# Patient Record
Sex: Female | Born: 1990 | Race: White | Hispanic: No | Marital: Single | Smoking: Never smoker
Health system: Southern US, Community
[De-identification: ages and names within clinical notes are randomized; demographics above are authoritative.]

## PROBLEM LIST (undated history)

## (undated) ENCOUNTER — Emergency Department: Discharge status: Absent without leave | Payer: Self-pay

## (undated) DIAGNOSIS — E282 Polycystic ovarian syndrome: Secondary | ICD-10-CM

## (undated) DIAGNOSIS — T7840XA Allergy, unspecified, initial encounter: Secondary | ICD-10-CM

## (undated) DIAGNOSIS — F329 Major depressive disorder, single episode, unspecified: Secondary | ICD-10-CM

## (undated) DIAGNOSIS — F419 Anxiety disorder, unspecified: Secondary | ICD-10-CM

## (undated) DIAGNOSIS — F32A Depression, unspecified: Secondary | ICD-10-CM

## (undated) HISTORY — DX: Major depressive disorder, single episode, unspecified: F32.9

## (undated) HISTORY — DX: Polycystic ovarian syndrome: E28.2

## (undated) HISTORY — DX: Depression, unspecified: F32.A

## (undated) HISTORY — DX: Allergy, unspecified, initial encounter: T78.40XA

## (undated) HISTORY — PX: EYE SURGERY: SHX253

## (undated) HISTORY — DX: Anxiety disorder, unspecified: F41.9

---
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
## 2011-01-25 ENCOUNTER — Ambulatory Visit (INDEPENDENT_AMBULATORY_CARE_PROVIDER_SITE_OTHER): Payer: BC Managed Care – PPO

## 2011-01-25 DIAGNOSIS — J069 Acute upper respiratory infection, unspecified: Secondary | ICD-10-CM

## 2011-01-25 DIAGNOSIS — R05 Cough: Secondary | ICD-10-CM

## 2011-01-25 DIAGNOSIS — J159 Unspecified bacterial pneumonia: Secondary | ICD-10-CM

## 2011-09-15 ENCOUNTER — Other Ambulatory Visit: Payer: Self-pay | Admitting: Physician Assistant

## 2011-09-16 NOTE — Telephone Encounter (Signed)
Need chart

## 2011-09-17 NOTE — Telephone Encounter (Signed)
Chart pulled °

## 2011-10-04 ENCOUNTER — Other Ambulatory Visit: Payer: Self-pay | Admitting: Physician Assistant

## 2011-10-04 NOTE — Telephone Encounter (Signed)
Patient's chart is at the nurses station in the pa pool pile. °

## 2011-11-04 ENCOUNTER — Other Ambulatory Visit: Payer: Self-pay | Admitting: Physician Assistant

## 2011-11-06 ENCOUNTER — Telehealth: Payer: Self-pay | Admitting: *Deleted

## 2011-11-06 MED ORDER — CITALOPRAM HYDROBROMIDE 40 MG PO TABS: 40.0000 mg | ORAL_TABLET | Freq: Every day | ORAL | Status: DC

## 2011-11-06 NOTE — Telephone Encounter (Signed)
lmom that rx was sent in and that we need to see her before any more refills

## 2011-11-06 NOTE — Telephone Encounter (Signed)
Needs a refill on Celexa.  Advised father that she would probably need a follow-up.  He states that she can come in early October if she can get a months refill.  Chart is [place] [id number]

## 2011-11-06 NOTE — Telephone Encounter (Signed)
We can refill it one time, but she will need to come in for an office visit for further refills. Review of her paper chart shows that she has not been seen for anything other than URI since 2011. We have citalopram 40 mg daily documented in her paper chart on her last office visit, which was 01/25/2011.

## 2011-11-11 ENCOUNTER — Telehealth: Payer: Self-pay

## 2011-11-11 MED ORDER — CITALOPRAM HYDROBROMIDE 40 MG PO TABS: 40.0000 mg | ORAL_TABLET | Freq: Every day | ORAL | Status: DC

## 2011-11-11 NOTE — Telephone Encounter (Signed)
Rx re-sent to pharmacy. We could also have called this in.

## 2011-11-11 NOTE — Telephone Encounter (Signed)
[facility] on [address] called for refills - sent several requests citalopram (CELEXA) 40 MG tablet  CBN [phone]

## 2011-11-11 NOTE — Telephone Encounter (Signed)
Father called to inquire about the medications.  It appears to have been sent on 9/18/  [facility] states they do not have a script.   Please check

## 2011-11-11 NOTE — Telephone Encounter (Signed)
This message did not go through clinic pool as it should have.

## 2011-11-12 ENCOUNTER — Other Ambulatory Visit: Payer: Self-pay | Admitting: Physician Assistant

## 2011-11-18 NOTE — Telephone Encounter (Signed)
done

## 2011-12-03 ENCOUNTER — Ambulatory Visit (INDEPENDENT_AMBULATORY_CARE_PROVIDER_SITE_OTHER): Payer: BC Managed Care – PPO | Admitting: Emergency Medicine

## 2011-12-03 VITALS — BP 132/74 | HR 94 | Temp 98.2°F | Resp 16 | Ht 66.0 in | Wt 213.0 lb

## 2011-12-03 DIAGNOSIS — F329 Major depressive disorder, single episode, unspecified: Secondary | ICD-10-CM

## 2011-12-03 DIAGNOSIS — L600 Ingrowing nail: Secondary | ICD-10-CM

## 2011-12-03 MED ORDER — CITALOPRAM HYDROBROMIDE 40 MG PO TABS: 40.0000 mg | ORAL_TABLET | Freq: Every day | ORAL | Status: DC

## 2011-12-03 MED ORDER — SULFAMETHOXAZOLE-TRIMETHOPRIM 800-160 MG PO TABS: 1.0000 | ORAL_TABLET | Freq: Two times a day (BID) | ORAL | Status: DC

## 2011-12-03 NOTE — Progress Notes (Signed)
[facility] [address] [place] [address] [phone]  Date:  12/03/2011   Name:  Sherry Mercado   DOB:  04/22/1990   MRN:  [id number]  PCP:  Loreen Freud, DO    Chief Complaint: Ingrown Toenail and Medication Refill   History of Present Illness:  Sherry Mercado is a 21 y.o. very pleasant female patient who presents with the following:  Long history of anxiety and depression and needs refill on her celexa.  Tolerating dose well and her symptoms are well controlled with no adverse side effects. No thoughts of suicide or self harm or harm to others.  sleeping and weight stable.  Long history of ingrown right great toenail.  Has lateral nail fold paronychia and tenderness on both medial and lateral nail folds.  Worse on lateral aspect.    Patient Active Problem List  Diagnosis  . ANXIETY STATE NOS  . ADHD  . URI  . PMS  . SYNCOPE    No past medical history on file.  No past surgical history on file.  History  Substance Use Topics  . Smoking status: Never Smoker   . Smokeless tobacco: Not on file  . Alcohol Use: Not on file    No family history on file.  No Known Allergies  Medication list has been reviewed and updated.  Current Outpatient Prescriptions on File Prior to Visit  Medication Sig Dispense Refill  . citalopram (CELEXA) 40 MG tablet TAKE 1 TABLET EVERY DAY  15 tablet  0    Review of Systems:  As per HPI, otherwise negative.    Physical Examination: Filed Vitals:   12/03/11 1253  BP: 132/74  Pulse: 94  Temp: 98.2 F (36.8 C)  Resp: 16   Filed Vitals:   12/03/11 1253  Height: 5\' 6"  (1.676 m)  Weight: 213 lb (96.616 kg)   Body mass index is 34.38 kg/(m^2). Ideal Body Weight: Weight in (lb) to have BMI = 25: 154.6    GEN: WDWN, NAD, Non-toxic, Alert & Oriented x 3 HEENT: Atraumatic, Normocephalic.  Ears and Nose: No external deformity. EXTR: No clubbing/cyanosis/edema NEURO: Normal gait.  PSYCH:  Normally interactive. Conversant. Not depressed or anxious appearing.  Calm demeanor.  GREAT TOE:  Right great toenail ingrown with paronychia lateral nail fold  Assessment and Plan: Discussed treatment options with patient who requests referral to podiatrist Ingrown right great toenail Septra Refill celexa  Carmelina Dane, MD  I have reviewed and agree with documentation. Robert P. Merla Riches, M.D.

## 2011-12-16 ENCOUNTER — Ambulatory Visit (INDEPENDENT_AMBULATORY_CARE_PROVIDER_SITE_OTHER): Payer: BC Managed Care – PPO | Admitting: Emergency Medicine

## 2011-12-16 VITALS — BP 117/74 | HR 67 | Temp 98.1°F | Resp 18 | Ht 66.0 in | Wt 212.0 lb

## 2011-12-16 DIAGNOSIS — J018 Other acute sinusitis: Secondary | ICD-10-CM

## 2011-12-16 DIAGNOSIS — J209 Acute bronchitis, unspecified: Secondary | ICD-10-CM

## 2011-12-16 MED ORDER — AMOXICILLIN-POT CLAVULANATE 875-125 MG PO TABS: 1.0000 | ORAL_TABLET | Freq: Two times a day (BID) | ORAL | Status: DC

## 2011-12-16 MED ORDER — PSEUDOEPHEDRINE-GUAIFENESIN ER 60-600 MG PO TB12: 1.0000 | ORAL_TABLET | Freq: Two times a day (BID) | ORAL | Status: DC

## 2011-12-16 MED ORDER — HYDROCOD POLST-CHLORPHEN POLST 10-8 MG/5ML PO LQCR: 5.0000 mL | Freq: Two times a day (BID) | ORAL | Status: DC | PRN

## 2011-12-16 NOTE — Progress Notes (Signed)
[facility] [address] [place] [address] [phone]  Date:  12/16/2011   Name:  Sherry Mercado   DOB:  1990-09-27   MRN:  [id number]  PCP:  Loreen Freud, DO    Chief Complaint: Sinusitis   History of Present Illness:  Sherry Mercado is a 21 y.o. very pleasant female patient who presents with the following:  Ill since 10/2 with nasal congestion and discharge that is mucoid.  Has marked post nasal drainage and a productive cough.  Malaise and myalgias. No fever or chills, wheezing or shortness of breath.  Marked sore throat.  Cough worse at night.  No improvement with OTC medications.  Some chronic allergy problems  Patient Active Problem List  Diagnosis  . ANXIETY STATE NOS  . ADHD  . URI  . PMS  . SYNCOPE    Past Medical History  Diagnosis Date  . Allergy   . Depression   . Asthma   . Anxiety     Past Surgical History  Procedure Date  . Eye surgery     History  Substance Use Topics  . Smoking status: Never Smoker   . Smokeless tobacco: Not on file  . Alcohol Use: Yes     social    Family History  Problem Relation Age of Onset  . Hearing loss Mother   . Bipolar disorder Mother   . Depression Mother   . Anxiety disorder Mother   . Bipolar disorder Brother   . Anxiety disorder Brother   . Cancer Maternal Grandmother     breast cancer  . Bipolar disorder Maternal Grandmother   . Depression Maternal Grandmother   . Hearing loss Maternal Grandfather     No Known Allergies  Medication list has been reviewed and updated.  Current Outpatient Prescriptions on File Prior to Visit  Medication Sig Dispense Refill  . citalopram (CELEXA) 40 MG tablet Take 1 tablet (40 mg total) by mouth daily.  90 tablet  1  . sulfamethoxazole-trimethoprim (BACTRIM DS,SEPTRA DS) 800-160 MG per tablet Take 1 tablet by mouth 2 (two) times daily.  20 tablet  0    Review of Systems:  As per HPI, otherwise negative.    Physical  Examination: Filed Vitals:   12/16/11 1149  BP: 117/74  Pulse: 67  Temp: 98.1 F (36.7 C)  Resp: 18   Filed Vitals:   12/16/11 1149  Height: 5\' 6"  (1.676 m)  Weight: 212 lb (96.163 kg)   Body mass index is 34.22 kg/(m^2). Ideal Body Weight: Weight in (lb) to have BMI = 25: 154.6   GEN: WDWN, NAD, Non-toxic, A & O x 3 HEENT: Atraumatic, Normocephalic. Neck supple. No masses, No LAD.  Oropharynx negative Ears and Nose: No external deformity.  Right TM injected, dull red, left negative CV: RRR, No M/G/R. No JVD. No thrill. No extra heart sounds. PULM: CTA B, no wheezes, crackles, rhonchi. No retractions. No resp. distress. No accessory muscle use. ABD: S, NT, ND, +BS. No rebound. No HSM. EXTR: No c/c/e NEURO Normal gait.  PSYCH: Normally interactive. Conversant. Not depressed or anxious appearing.  Calm demeanor.    Assessment and Plan: Sinusitis Otitis media augmentin mucinex d tussionex   Carmelina Dane, MD

## 2011-12-23 NOTE — Progress Notes (Signed)
Reviewed and agree.

## 2012-02-06 ENCOUNTER — Ambulatory Visit (INDEPENDENT_AMBULATORY_CARE_PROVIDER_SITE_OTHER): Payer: BC Managed Care – PPO | Admitting: Physician Assistant

## 2012-02-06 VITALS — BP 108/69 | HR 99 | Temp 98.2°F | Resp 18 | Ht 66.0 in | Wt 218.0 lb

## 2012-02-06 DIAGNOSIS — J069 Acute upper respiratory infection, unspecified: Secondary | ICD-10-CM

## 2012-02-06 DIAGNOSIS — R05 Cough: Secondary | ICD-10-CM

## 2012-02-06 MED ORDER — GUAIFENESIN ER 1200 MG PO TB12: 1.0000 | ORAL_TABLET | Freq: Two times a day (BID) | ORAL | Status: DC | PRN

## 2012-02-06 MED ORDER — BENZONATATE 100 MG PO CAPS: 100.0000 mg | ORAL_CAPSULE | Freq: Three times a day (TID) | ORAL | Status: DC | PRN

## 2012-02-06 MED ORDER — HYDROCOD POLST-CHLORPHEN POLST 10-8 MG/5ML PO LQCR: 5.0000 mL | Freq: Two times a day (BID) | ORAL | Status: DC | PRN

## 2012-02-06 MED ORDER — IPRATROPIUM BROMIDE 0.03 % NA SOLN: 2.0000 | Freq: Two times a day (BID) | NASAL | Status: DC

## 2012-02-06 NOTE — Patient Instructions (Addendum)
Begin using Mucinex twice daily for relief of congestion.  Use Atrovent nasal spray twice daily for relief of nasal congestion.  For cough, use Tessalon during the day and Tussionex at night.  Plenty of fluids and rest.  Good hand hygiene to prevent the spread of germs.   Let us know if you are worsening or not improving.     Upper Respiratory Infection, Adult An upper respiratory infection (URI) is also sometimes known as the common cold. The upper respiratory tract includes the nose, sinuses, throat, trachea, and bronchi. Bronchi are the airways leading to the lungs. Most people improve within 1 week, but symptoms can last up to 2 weeks. A residual cough may last even longer.  CAUSES Many different viruses can infect the tissues lining the upper respiratory tract. The tissues become irritated and inflamed and often become very moist. Mucus production is also common. A cold is contagious. You can easily spread the virus to others by oral contact. This includes kissing, sharing a glass, coughing, or sneezing. Touching your mouth or nose and then touching a surface, which is then touched by another person, can also spread the virus. SYMPTOMS  Symptoms typically develop 1 to 3 days after you come in contact with a cold virus. Symptoms vary from person to person. They may include:  Runny nose.  Sneezing.  Nasal congestion.  Sinus irritation.  Sore throat.  Loss of voice (laryngitis).  Cough.  Fatigue.  Muscle aches.  Loss of appetite.  Headache.  Low-grade fever. DIAGNOSIS  You might diagnose your own cold based on familiar symptoms, since most people get a cold 2 to 3 times a year. Your caregiver can confirm this based on your exam. Most importantly, your caregiver can check that your symptoms are not due to another disease such as strep throat, sinusitis, pneumonia, asthma, or epiglottitis. Blood tests, throat tests, and X-rays are not necessary to diagnose a common cold, but they  may sometimes be helpful in excluding other more serious diseases. Your caregiver will decide if any further tests are required. RISKS AND COMPLICATIONS  You may be at risk for a more severe case of the common cold if you smoke cigarettes, have chronic heart disease (such as heart failure) or lung disease (such as asthma), or if you have a weakened immune system. The very young and very old are also at risk for more serious infections. Bacterial sinusitis, middle ear infections, and bacterial pneumonia can complicate the common cold. The common cold can worsen asthma and chronic obstructive pulmonary disease (COPD). Sometimes, these complications can require emergency medical care and may be life-threatening. PREVENTION  The best way to protect against getting a cold is to practice good hygiene. Avoid oral or hand contact with people with cold symptoms. Wash your hands often if contact occurs. There is no clear evidence that vitamin C, vitamin E, echinacea, or exercise reduces the chance of developing a cold. However, it is always recommended to get plenty of rest and practice good nutrition. TREATMENT  Treatment is directed at relieving symptoms. There is no cure. Antibiotics are not effective, because the infection is caused by a virus, not by bacteria. Treatment may include:  Increased fluid intake. Sports drinks offer valuable electrolytes, sugars, and fluids.  Breathing heated mist or steam (vaporizer or shower).  Eating chicken soup or other clear broths, and maintaining good nutrition.  Getting plenty of rest.  Using gargles or lozenges for comfort.  Controlling fevers with ibuprofen or acetaminophen as  directed by your caregiver.  Increasing usage of your inhaler if you have asthma. Zinc gel and zinc lozenges, taken in the first 24 hours of the common cold, can shorten the duration and lessen the severity of symptoms. Pain medicines may help with fever, muscle aches, and throat pain. A  variety of non-prescription medicines are available to treat congestion and runny nose. Your caregiver can make recommendations and may suggest nasal or lung inhalers for other symptoms.  HOME CARE INSTRUCTIONS   Only take over-the-counter or prescription medicines for pain, discomfort, or fever as directed by your caregiver.  Use a warm mist humidifier or inhale steam from a shower to increase air moisture. This may keep secretions moist and make it easier to breathe.  Drink enough water and fluids to keep your urine clear or pale yellow.  Rest as needed.  Return to work when your temperature has returned to normal or as your caregiver advises. You may need to stay home longer to avoid infecting others. You can also use a face mask and careful hand washing to prevent spread of the virus. SEEK MEDICAL CARE IF:   After the first few days, you feel you are getting worse rather than better.  You need your caregiver's advice about medicines to control symptoms.  You develop chills, worsening shortness of breath, or brown or red sputum. These may be signs of pneumonia.  You develop yellow or brown nasal discharge or pain in the face, especially when you bend forward. These may be signs of sinusitis.  You develop a fever, swollen neck glands, pain with swallowing, or white areas in the back of your throat. These may be signs of strep throat. SEEK IMMEDIATE MEDICAL CARE IF:   You have a fever.  You develop severe or persistent headache, ear pain, sinus pain, or chest pain.  You develop wheezing, a prolonged cough, cough up blood, or have a change in your usual mucus (if you have chronic lung disease).  You develop sore muscles or a stiff neck. Document Released: 07/31/2000 Document Revised: 04/29/2011 Document Reviewed: 06/08/2010 [facility] Patient Information 2013 [place], [place].

## 2012-02-06 NOTE — Progress Notes (Signed)
  Subjective:    Patient ID: Sherry Mercado, female    DOB: [birth year], 21 y.o.   MRN: [id number]  HPI   Sherry Mercado is a 21 yr old female here today stating she has been "pretty sick" since the beginning of December.  Does not remember exactly when symptoms began, but has had a "really bad cough, a bit of a sore throat, and a blocked nose".  Her cough is variably productive.  It is keeping her awake at night.  She is not sure if she is having post-nasal drainage, not sure what that would feel like.  Occasionally has sinus pressure, no tooth pain.  Ears feel ok.  Denies GI symptoms, fever, or chills.  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  HENT: Positive for ear pain, congestion, sore throat, rhinorrhea and sinus pressure.   Respiratory: Positive for cough. Negative for shortness of breath and wheezing.   Cardiovascular: Negative.   Gastrointestinal: Negative.   Musculoskeletal: Negative.   Skin: Negative.   Neurological: Negative.        Objective:   Physical Exam  Vitals reviewed. Constitutional: She is oriented to person, place, and time. She appears well-developed and well-nourished. No distress.  HENT:  Head: Normocephalic and atraumatic.  Right Ear: Tympanic membrane and ear canal normal.  Left Ear: Tympanic membrane and ear canal normal.  Nose: Mucosal edema present. Right sinus exhibits no maxillary sinus tenderness and no frontal sinus tenderness. Left sinus exhibits no maxillary sinus tenderness and no frontal sinus tenderness.  Mouth/Throat: Uvula is midline, oropharynx is clear and moist and mucous membranes are normal.  Eyes: Right eye exhibits no discharge. Left eye exhibits no discharge. No scleral icterus.  Neck: Neck supple.  Cardiovascular: Normal rate, regular rhythm, normal heart sounds and intact distal pulses.  Exam reveals no gallop and no friction rub.    No murmur heard. Pulmonary/Chest: Effort normal and breath sounds normal. She has no wheezes. She has no rales.  Lymphadenopathy:    She has no cervical adenopathy.  Neurological: She is alert and oriented to person, place, and time.  Skin: Skin is warm and dry.  Psychiatric: Her speech is normal and behavior is normal. Her affect is blunt.      Filed Vitals:   02/06/12 1410  BP: 108/69  Pulse: 99  Temp: 98.2 F (36.8 C)  Resp: 18       Assessment & Plan:   1. URI (upper respiratory infection)  ipratropium (ATROVENT) 0.03 % nasal spray  2. Cough  chlorpheniramine-HYDROcodone (TUSSIONEX PENNKINETIC ER) 10-8 MG/5ML LQCR, Guaifenesin (MUCINEX MAXIMUM STRENGTH) 1200 MG TB12, benzonatate (TESSALON) 100 MG capsule    Sherry Mercado is a 21 yr old female here with cough and nasal congestion, suspect viral URI.  She is afebrile, and exam is reassuring.  Lungs are CTA, throat is clear, no sinus tenderness.  Will treat symptoms with Mucinex, Atrovent, Tessalon, and Tussionex.  Encouraged plenty of fluids and rest.  Discussed RTC precautions.  Pt is in agreement with this plan.

## 2012-02-08 ENCOUNTER — Telehealth: Payer: Self-pay

## 2012-02-08 NOTE — Telephone Encounter (Signed)
Pt not feeling any better and would like to know what to do next     [phone]

## 2012-02-08 NOTE — Telephone Encounter (Signed)
lmom to cb. 

## 2012-02-08 NOTE — Telephone Encounter (Signed)
Pt states that she is not better and that her mucus is now green, throat hurt more, head hurt a lot and she feels fatigued. Please advised

## 2012-02-09 MED ORDER — AZITHROMYCIN 250 MG PO TABS: ORAL_TABLET | ORAL | Status: DC

## 2012-02-09 NOTE — Telephone Encounter (Signed)
lmom that rx was sent into pharmacy 

## 2012-02-09 NOTE — Addendum Note (Signed)
Addended by: Anders Simmonds on: 02/09/2012 12:28 PM   Modules accepted: Orders

## 2012-02-09 NOTE — Telephone Encounter (Signed)
I sent in an antibiotic for her to start today

## 2012-02-24 ENCOUNTER — Ambulatory Visit: Payer: BC Managed Care – PPO

## 2012-02-24 ENCOUNTER — Ambulatory Visit (INDEPENDENT_AMBULATORY_CARE_PROVIDER_SITE_OTHER): Payer: BC Managed Care – PPO | Admitting: Family Medicine

## 2012-02-24 VITALS — BP 142/83 | HR 111 | Temp 98.6°F | Resp 16 | Ht 66.0 in | Wt 220.0 lb

## 2012-02-24 DIAGNOSIS — R059 Cough, unspecified: Secondary | ICD-10-CM

## 2012-02-24 DIAGNOSIS — R053 Chronic cough: Secondary | ICD-10-CM

## 2012-02-24 DIAGNOSIS — N912 Amenorrhea, unspecified: Secondary | ICD-10-CM

## 2012-02-24 DIAGNOSIS — R05 Cough: Secondary | ICD-10-CM

## 2012-02-24 DIAGNOSIS — J329 Chronic sinusitis, unspecified: Secondary | ICD-10-CM

## 2012-02-24 DIAGNOSIS — N911 Secondary amenorrhea: Secondary | ICD-10-CM

## 2012-02-24 LAB — POCT CBC
Granulocyte percent: 69 %G (ref 37–80)
HCT, POC: 46.7 % (ref 37.7–47.9)
Hemoglobin: 14.7 g/dL (ref 12.2–16.2)
Lymph, poc: 2.1 (ref 0.6–3.4)
MCH, POC: 28.8 pg (ref 27–31.2)
MCHC: 31.5 g/dL — AB (ref 31.8–35.4)
MCV: 91.5 fL (ref 80–97)
MID (cbc): 0.4 (ref 0–0.9)
MPV: 7.7 fL (ref 0–99.8)
POC Granulocyte: 5.7 (ref 2–6.9)
POC LYMPH PERCENT: 25.8 % (ref 10–50)
POC MID %: 5.2 % (ref 0–12)
Platelet Count, POC: 354 10*3/uL (ref 142–424)
RBC: 5.1 M/uL (ref 4.04–5.48)
RDW, POC: 13.4 %
WBC: 8.3 10*3/uL (ref 4.6–10.2)

## 2012-02-24 LAB — POCT URINE PREGNANCY: Preg Test, Ur: NEGATIVE

## 2012-02-24 MED ORDER — AMOXICILLIN-POT CLAVULANATE 875-125 MG PO TABS: 1.0000 | ORAL_TABLET | Freq: Two times a day (BID) | ORAL | Status: DC

## 2012-02-24 NOTE — Progress Notes (Signed)
[facility]:  Office Visit  Chief Complaint:  Chief Complaint  Patient presents with  . Cough    since beginning of december, coughing up green mucus    HPI: Sherry Mercado is a 22 y.o. female who complains of  Being sick with URI symptoms since December. Was initially seen and was told it was vural ,s xs treatment only; she did not feel better so was put on Azithromycin. Helped but then stopped working. She is here because she is not feeling better. Still coughing up green productive sputum. Continues to have sinus pressure. Denies ear pain, SOB, CP, fevers, chills. She may have chronic sinus issues since she has both asthma and allergies. She was given the atrovent nasal spray and also tussionex to use, she has not been using the NS and feels the tussionex helps minimally with her cough.   The patient also has not had a period for 2 years. She has had her periods in the past but was irregular and also had mood changes peri-menses and was put on birth control for it . She has not had a menstrual cycle in the past 2 years. Currently sexually active, is UTD on her Gardasil vaccine. Menarche <16. Irregular cycles. Has had weight gain.   Past Medical History  Diagnosis Date  . Allergy   . Depression   . Asthma   . Anxiety    Past Surgical History  Procedure Date  . Eye surgery    History   Social History  . Marital Status: Single    Spouse Name: N/A    Number of Children: N/A  . Years of Education: N/A   Social History Main Topics  . Smoking status: Never Smoker   . Smokeless tobacco: None  . Alcohol Use: Yes     Comment: social  . Drug Use: None  . Sexually Active: Yes -- Female partner(s)    Birth Control/ Protection: Condom   Other Topics Concern  . None   Social History Narrative  . None   Family History  Problem Relation Age of Onset  . Hearing loss Mother   . Bipolar disorder Mother   . Depression Mother   . Anxiety disorder Mother   .  Bipolar disorder Brother   . Anxiety disorder Brother   . Cancer Maternal Grandmother     breast cancer  . Bipolar disorder Maternal Grandmother   . Depression Maternal Grandmother   . Hearing loss Maternal Grandfather    No Known Allergies Prior to Admission medications   Medication Sig Start Date End Date Taking? Authorizing Provider  citalopram (CELEXA) 40 MG tablet Take 1 tablet (40 mg total) by mouth daily. 12/03/11  Yes Phillips Odor, MD  azithromycin (ZITHROMAX) 250 MG tablet Take 2 today, 1 days 2-5 02/09/12   Anders Simmonds, PA-C  benzonatate (TESSALON) 100 MG capsule Take 1-2 capsules (100-200 mg total) by mouth 3 (three) times daily as needed for cough. 02/06/12   Eleanore Delia Chimes, PA-C  chlorpheniramine-HYDROcodone (TUSSIONEX PENNKINETIC ER) 10-8 MG/5ML LQCR Take 5 mLs by mouth every 12 (twelve) hours as needed (cough). 02/06/12   Eleanore Delia Chimes, PA-C  Guaifenesin (MUCINEX MAXIMUM STRENGTH) 1200 MG TB12 Take 1 tablet (1,200 mg total) by mouth every 12 (twelve) hours as needed. 02/06/12   Eleanore Delia Chimes, PA-C  ipratropium (ATROVENT) 0.03 % nasal spray Place 2 sprays into the nose 2 (two) times daily. 02/06/12   Godfrey Pick, PA-C  ROS: The patient denies fevers, chills, night sweats, unintentional weight loss, chest pain, palpitations, wheezing, dyspnea on exertion, nausea, vomiting, abdominal pain, dysuria, hematuria, melena, numbness, weakness, or tingling.   All other systems have been reviewed and were otherwise negative with the exception of those mentioned in the HPI and as above.    PHYSICAL EXAM: Filed Vitals:   02/24/12 1618  BP: 142/83  Pulse: 111  Temp: 98.6 F (37 C)  Resp: 16   Filed Vitals:   02/24/12 1618  Height: 5\' 6"  (1.676 m)  Weight: 220 lb (99.791 kg)   Body mass index is 35.51 kg/(m^2).  General: Alert, no acute distress, obese HEENT:  Normocephalic, atraumatic, oropharynx patent.  Cardiovascular:  Regular rate and rhythm, no rubs  murmurs or gallops.  No Carotid bruits, radial pulse intact. No pedal edema.  Respiratory: Clear to auscultation bilaterally.  No wheezes, rales, or rhonchi.  No cyanosis, no use of accessory musculature GI: No organomegaly, abdomen is soft and non-tender, positive bowel sounds.  No masses. Skin: No rashes. Neurologic: Facial musculature symmetric. Psychiatric: Patient is appropriate throughout our interaction. Lymphatic: No cervical lymphadenopathy Musculoskeletal: Gait intact.   LABS: Results for orders placed in visit on 02/24/12  POCT CBC      Component Value Range   WBC 8.3  4.6 - 10.2 K/uL   Lymph, poc 2.1  0.6 - 3.4   POC LYMPH PERCENT 25.8  10 - 50 %L   MID (cbc) 0.4  0 - 0.9   POC MID % 5.2  0 - 12 %M   POC Granulocyte 5.7  2 - 6.9   Granulocyte percent 69.0  37 - 80 %G   RBC 5.10  4.04 - 5.48 M/uL   Hemoglobin 14.7  12.2 - 16.2 g/dL   HCT, POC 16.1  09.6 - 47.9 %   MCV 91.5  80 - 97 fL   MCH, POC 28.8  27 - 31.2 pg   MCHC 31.5 (*) 31.8 - 35.4 g/dL   RDW, POC 04.5     Platelet Count, POC 354  142 - 424 K/uL   MPV 7.7  0 - 99.8 fL  POCT URINE PREGNANCY      Component Value Range   Preg Test, Ur Negative       EKG/XRAY:   Primary read interpreted by Dr. Conley Rolls at [facility]. + Bronchitic changes   ASSESSMENT/PLAN: Encounter Diagnoses  Name Primary?  . Amenorrhea, secondary Yes  . Cough, persistent   . Sinusitis     Augmentin 875 mg BID x 10 days Refer to Ob/Gyn for secondary amenorrhea x 2 years Labs pending: TSH, LH/FSH,Prolactin F/u prn   Creig Landin PHUONG, DO 02/26/2012 12:12 PM

## 2012-02-25 LAB — LUTEINIZING HORMONE: LH: 18.2 m[IU]/mL

## 2012-02-25 LAB — TSH: TSH: 1.529 u[IU]/mL (ref 0.350–4.500)

## 2012-02-25 LAB — PROLACTIN: Prolactin: 8.8 ng/mL

## 2012-02-25 LAB — FOLLICLE STIMULATING HORMONE: FSH: 6.9 m[IU]/mL

## 2012-02-26 ENCOUNTER — Telehealth: Payer: Self-pay | Admitting: Family Medicine

## 2012-02-26 NOTE — Telephone Encounter (Signed)
LM that would like to d/w patient labs. TSH, Prolactin, LH, FSH all normal if she asks. Will refer her to OB/Gyn for further eval of her secondary amenorrhea since this has been going on for 2 years. They may put her back on OCP or do a pregestone challenge, that will be up to them and patient to decide.

## 2012-03-11 ENCOUNTER — Telehealth: Payer: Self-pay

## 2012-03-11 NOTE — Telephone Encounter (Signed)
Patient was seen for URI, antibiotics not working. Wants to know if we can rx something different for her.  Best [phone]

## 2012-03-11 NOTE — Telephone Encounter (Signed)
Pt last seen 2 weeks ago, we cannot prescribe any other antibiotics without seeing her.  Last note states pt has both asthma and allergies.  It's possible that these are not optimally controlled and are leading to symptoms.  Pt should come back in to discuss.

## 2012-03-11 NOTE — Telephone Encounter (Signed)
Spoke with pt, she states she is still coughing stuff up, sneezing all day, and feeling miserable. The ABX did work at first but SX's came back after she stopped the ABX. She has no fever and her mucus is really thick white mucus with a greenish brown tent to it. Please advise.

## 2012-03-12 NOTE — Telephone Encounter (Signed)
Called patient to advise of need for return to office.

## 2012-03-12 NOTE — Telephone Encounter (Signed)
Pt CB and I advised her to RTC for eval. Pt agreed.

## 2012-03-13 ENCOUNTER — Ambulatory Visit (INDEPENDENT_AMBULATORY_CARE_PROVIDER_SITE_OTHER): Payer: BC Managed Care – PPO | Admitting: Family Medicine

## 2012-03-13 VITALS — BP 120/76 | HR 80 | Resp 16 | Ht 65.0 in | Wt 218.0 lb

## 2012-03-13 DIAGNOSIS — R059 Cough, unspecified: Secondary | ICD-10-CM

## 2012-03-13 DIAGNOSIS — J069 Acute upper respiratory infection, unspecified: Secondary | ICD-10-CM

## 2012-03-13 DIAGNOSIS — R05 Cough: Secondary | ICD-10-CM

## 2012-03-13 DIAGNOSIS — I279 Pulmonary heart disease, unspecified: Secondary | ICD-10-CM

## 2012-03-13 DIAGNOSIS — J019 Acute sinusitis, unspecified: Secondary | ICD-10-CM

## 2012-03-13 MED ORDER — CLARITHROMYCIN 500 MG PO TABS: 500.0000 mg | ORAL_TABLET | Freq: Two times a day (BID) | ORAL | Status: DC

## 2012-03-13 MED ORDER — FLUTICASONE PROPIONATE 50 MCG/ACT NA SUSP: 2.0000 | Freq: Every day | NASAL | Status: DC

## 2012-03-13 NOTE — Progress Notes (Signed)
 [facility]:  Office Visit  Chief Complaint:  Chief Complaint  Patient presents with  . URI    has taken 2 antibiotics, continues to cough has congestion    HPI: Sherry Mercado is a 22 y.o. female who complains of similar sxs to what she had in December and in early January. She was given Azithromycin and then this was followed by Augmentin. The Augmentin really helped but she felt it was too short, she was getting better and then got worse after completing it. Still has cough.  Drainage, white to green productive cough, nasal congestion.  Denies ear pain, SOB, CP, fevers, chills. She may have chronic sinus issues since she has both asthma and allergies. She was given the atrovent nasal spray and also tussionex to use, she has not been using the NS and feels the tussionex helps minimally with her cough. She had been using the mucinex and tessalon perles but no improvement. She has not taken any antihistamines, her father is present during the office visit and states that she usually has seasonal allergies and that her allergies end in October. 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  The show is in February.   Past Medical History  Diagnosis Date  . Allergy   . Depression   . Asthma   . Anxiety    Past Surgical History  Procedure Date  . Eye surgery    History   Social History  . Marital Status: Single    Spouse Name: N/A    Number of Children: N/A  . Years of Education: N/A   Social History Main Topics  . Smoking status: Never Smoker   . Smokeless tobacco: None  . Alcohol Use: Yes     Comment: social  . Drug Use: None  . Sexually Active: Yes -- Female partner(s)    Birth Control/ Protection: Condom   Other Topics Concern  . None   Social History Narrative  . None   Family History  Problem Relation Age of Onset  . Hearing loss Mother   . Bipolar disorder Mother   . Depression Mother   .  Anxiety disorder Mother   . Bipolar disorder Brother   . Anxiety disorder Brother   . Cancer Maternal Grandmother     breast cancer  . Bipolar disorder Maternal Grandmother   . Depression Maternal Grandmother   . Hearing loss Maternal Grandfather    No Known Allergies Prior to Admission medications   Medication Sig Start Date End Date Taking? Authorizing Provider  citalopram (CELEXA) 40 MG tablet Take 1 tablet (40 mg total) by mouth daily. 12/03/11  Yes Phillips Odor, MD  ipratropium (ATROVENT) 0.03 % nasal spray Place 2 sprays into the nose 2 (two) times daily. 02/06/12  Yes Eleanore E Debbra Riding, PA-C  amoxicillin-clavulanate (AUGMENTIN) 875-125 MG per tablet Take 1 tablet by mouth 2 (two) times daily. 02/24/12    P , DO  azithromycin (ZITHROMAX) 250 MG tablet Take 2 today, 1 days 2-5 02/09/12   Anders Simmonds, PA-C  benzonatate (TESSALON) 100 MG capsule Take 1-2 capsules (100-200 mg total) by mouth 3 (three) times daily as needed for cough. 02/06/12   Eleanore Delia Chimes, PA-C  chlorpheniramine-HYDROcodone (TUSSIONEX PENNKINETIC ER) 10-8 MG/5ML LQCR Take 5 mLs by mouth every 12 (twelve) hours as needed (cough). 02/06/12   Eleanore Delia Chimes, PA-C  clarithromycin (BIAXIN) 500 MG tablet Take 1  tablet (500 mg total) by mouth 2 (two) times daily. 03/13/12    P , DO  fluticasone (FLONASE) 50 MCG/ACT nasal spray Place 2 sprays into the nose daily. 03/13/12    P , DO  Guaifenesin (MUCINEX MAXIMUM STRENGTH) 1200 MG TB12 Take 1 tablet (1,200 mg total) by mouth every 12 (twelve) hours as needed. 02/06/12   Eleanore Delia Chimes, PA-C     ROS: The patient denies fevers, chills, night sweats, unintentional weight loss, chest pain, palpitations, wheezing, dyspnea on exertion, nausea, vomiting, abdominal pain, dysuria, hematuria, melena, numbness, weakness, or tingling.   All other systems have been reviewed and were otherwise negative with the exception of those mentioned in the HPI and as above.     PHYSICAL EXAM: Filed Vitals:   03/13/12 1631  BP: 120/76  Pulse: 80  Resp: 16   Filed Vitals:   03/13/12 1631  Height: 5\' 5"  (1.651 m)  Weight: 218 lb (98.884 kg)   Body mass index is 36.28 kg/(m^2).  General: Alert, no acute distress HEENT:  Normocephalic, atraumatic, oropharynx patent. TM nl. Boggy erythematous nares. Minimal facial tenderness. Erythematous throat Cardiovascular:  Regular rate and rhythm, no rubs murmurs or gallops.  No Carotid bruits, radial pulse intact. No pedal edema.  Respiratory: Clear to auscultation bilaterally.  No wheezes, rales, or rhonchi.  No cyanosis, no use of accessory musculature GI: No organomegaly, abdomen is soft and non-tender, positive bowel sounds.  No masses. Skin: No rashes. Neurologic: Facial musculature symmetric. Psychiatric: Patient is appropriate throughout our interaction. Lymphatic: No cervical lymphadenopathy Musculoskeletal: Gait intact.   LABS:    EKG/XRAY:   Primary read interpreted by Dr. Conley Rolls at [facility].   ASSESSMENT/PLAN: 1. Sinusitis ( acute vs chronic) 2. URI/Brocnitis 3. Cough  I d/w patient in depth about this is most likely viral or chronic sinusitis with a touch of bronchitis which has a cough component to it. She is reinjuring and irritating her throat by coughing so we need to treat the recurrent cough. This is afterall the peak flu and cold season.  She needs to do sxs treatment ie flushing out her nose with saline nasal sprays, using flonase. She can also try a trial of antihistamines. Take cough suppressants prn Patient and father were adament about getting rx for abx and they wanted Biaxin which they heard from a friend works really well. I told them that they were already on a macrolide earlier and it did not seem to help.  D/w pt risks ie QT prolongation/heart issues since she is also on celexa, risk of C. Difficile due to abx use, GI upset, drug resistance, etc.. And unnnecessary use and ineffectiveness   of abx in setting of viral infection.  Again patient was insistent  on getting abx even after discussion. I advise that it would be better for her to try flonase and cephacol first and see how she does.  Rx flonase, gave cephacol samples F/u prn    ,  PHUONG, DO 03/13/2012 5:27 PM

## 2012-05-12 ENCOUNTER — Encounter: Payer: Self-pay | Admitting: Family Medicine

## 2012-05-25 ENCOUNTER — Other Ambulatory Visit: Payer: Self-pay | Admitting: Emergency Medicine

## 2012-06-23 ENCOUNTER — Other Ambulatory Visit: Payer: Self-pay | Admitting: Emergency Medicine

## 2012-09-16 ENCOUNTER — Other Ambulatory Visit: Payer: Self-pay | Admitting: Internal Medicine

## 2012-09-29 ENCOUNTER — Ambulatory Visit (INDEPENDENT_AMBULATORY_CARE_PROVIDER_SITE_OTHER): Payer: BC Managed Care – PPO | Admitting: Family Medicine

## 2012-09-29 VITALS — BP 110/60 | HR 93 | Temp 97.8°F | Resp 18 | Ht 66.0 in | Wt 226.0 lb

## 2012-09-29 DIAGNOSIS — F341 Dysthymic disorder: Secondary | ICD-10-CM

## 2012-09-29 DIAGNOSIS — F329 Major depressive disorder, single episode, unspecified: Secondary | ICD-10-CM

## 2012-09-29 DIAGNOSIS — F32A Depression, unspecified: Secondary | ICD-10-CM

## 2012-09-29 MED ORDER — CITALOPRAM HYDROBROMIDE 40 MG PO TABS: 40.0000 mg | ORAL_TABLET | Freq: Every day | ORAL | Status: DC

## 2012-09-29 NOTE — Progress Notes (Signed)
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  He's doing quite well on the Celexa. She has no nausea or vomiting and the anxiety and depression well controlled.  Patient is also very concerned about her weight. She wants to have a prescription for phentermine. Her friend has tried it,  done reasonably well on it.  Objective: HEENT: Normal Chest: Clear Heart: Regular no murmur Body habitus-obese, no acute distress  20 minute discussion on weight loss.  Plan: Patient to start eating breakfast, avoiding sodas, and not eating after 8:00 at night.  Celexa renewed  Signed, Sheila Oats.D.

## 2013-05-04 ENCOUNTER — Ambulatory Visit (INDEPENDENT_AMBULATORY_CARE_PROVIDER_SITE_OTHER): Payer: BC Managed Care – PPO | Admitting: Physician Assistant

## 2013-05-04 VITALS — BP 118/78 | HR 105 | Temp 97.9°F | Resp 16 | Ht 66.0 in | Wt 237.0 lb

## 2013-05-04 DIAGNOSIS — R0981 Nasal congestion: Secondary | ICD-10-CM

## 2013-05-04 DIAGNOSIS — J3489 Other specified disorders of nose and nasal sinuses: Secondary | ICD-10-CM

## 2013-05-04 DIAGNOSIS — J029 Acute pharyngitis, unspecified: Secondary | ICD-10-CM

## 2013-05-04 LAB — POCT RAPID STREP A (OFFICE): Rapid Strep A Screen: NEGATIVE

## 2013-05-04 MED ORDER — FIRST-DUKES MOUTHWASH MT SUSP: 5.0000 mL | OROMUCOSAL | Status: DC | PRN

## 2013-05-04 MED ORDER — IPRATROPIUM BROMIDE 0.03 % NA SOLN: 2.0000 | Freq: Two times a day (BID) | NASAL | Status: DC

## 2013-05-04 MED ORDER — AMOXICILLIN 875 MG PO TABS
875.0000 mg | ORAL_TABLET | Freq: Two times a day (BID) | ORAL | Status: DC
Start: 2013-05-04 — End: 2013-11-02

## 2013-05-04 NOTE — Progress Notes (Signed)
   Subjective:    Patient ID: Sherry Mercado, female    DOB: 1990-04-20, 23 y.o.   MRN: [id number]  HPI 23 year old female presents for evaluation of acute onset of sore throat. Symptoms started yesterday and have progressively worsened. States the worst pain was last night.  She has pain with swallowing and does admit that the right side seems to be worse than the left.  Also complains of some nasal congestion and "stuffiness."  Denies cough, fever, chills, nausea, vomiting, sinus pain, headache, or dizziness. She has not taken any OTC medications. Has used lozenges and been drinking tea.   Patient is otherwise doing well with no other concerns. 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  Is worried that she will not be feeling better in time.     Review of Systems  Constitutional: Negative for fever and chills.  HENT: Positive for congestion, rhinorrhea, sinus pressure and sore throat. Negative for ear pain.   Respiratory: Negative for cough, chest tightness, shortness of breath and wheezing.   Gastrointestinal: Negative for nausea and vomiting.  Neurological: Negative for dizziness and headaches.       Objective:   Physical Exam  Constitutional: She is oriented to person, place, and time. She appears well-developed and well-nourished.  HENT:  Head: Normocephalic and atraumatic.  Right Ear: Hearing, tympanic membrane, external ear and ear canal normal.  Left Ear: Hearing, tympanic membrane, external ear and ear canal normal.  Mouth/Throat: Uvula is midline and mucous membranes are normal. Posterior oropharyngeal erythema present. No oropharyngeal exudate, posterior oropharyngeal edema or tonsillar abscesses.  Eyes: Conjunctivae are normal.  Neck: Normal range of motion. Neck supple.  Cardiovascular: Normal rate, regular rhythm and normal heart sounds.   Pulmonary/Chest: Effort normal and breath sounds normal.  Lymphadenopathy:    She has no cervical adenopathy.    Neurological: She is alert and oriented to person, place, and time.  Psychiatric: She has a normal mood and affect. Her behavior is normal. Judgment and thought content normal.     Results for orders placed in visit on 05/04/13  POCT RAPID STREP A (OFFICE)      Result Value Ref Range   Rapid Strep A Screen Negative  Negative        Assessment & Plan:   Acute pharyngitis - Plan: POCT rapid strep A, Culture, Group A Strep, amoxicillin (AMOXIL) 875 MG tablet, Diphenhyd-Hydrocort-Nystatin (FIRST-DUKES MOUTHWASH) SUSP  Nasal congestion - Plan: ipratropium (ATROVENT) 0.03 % nasal spray  Likely viral pharyngitis but will treat aggressively due to upcoming performance Start amoxicillin 875 mg bid x 10 days. Throat culture pending Atrovent NS twice daily to help with PND and sinus pressure [facility] mouthwash q2-3hours prn pain Recommend ibuprofen 600-800 mg tid with food Increase fluids and rest Follow up if symptoms worsening or fail to improve.

## 2013-05-06 LAB — CULTURE, GROUP A STREP: Organism ID, Bacteria: NORMAL

## 2013-11-02 ENCOUNTER — Ambulatory Visit (INDEPENDENT_AMBULATORY_CARE_PROVIDER_SITE_OTHER): Payer: BC Managed Care – PPO | Admitting: Family Medicine

## 2013-11-02 VITALS — BP 130/72 | HR 101 | Temp 98.2°F | Resp 16 | Ht 65.5 in | Wt 241.0 lb

## 2013-11-02 DIAGNOSIS — J029 Acute pharyngitis, unspecified: Secondary | ICD-10-CM

## 2013-11-02 MED ORDER — AMOXICILLIN 875 MG PO TABS: 875.0000 mg | ORAL_TABLET | Freq: Two times a day (BID) | ORAL | Status: DC

## 2013-11-02 NOTE — Progress Notes (Signed)
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  She has had 5 days of URI symptoms including sore throat and ear pain.  No fever.  No cough  Currently using sinus wash, steamer, drinking tea  Objective:  NAD Oroph:  Red, no exudates, mildly swollen TM's:  Slight bulge right  Neck:  Supple, no adenopathy Chest:  Clear Heart:  Reg, no murmur  Assessment:  Acute pharyngitis, unspecified pharyngitis type - Plan: amoxicillin (AMOXIL) 875 MG tablet  Signed, Elvina Sidle, MD

## 2013-11-04 ENCOUNTER — Other Ambulatory Visit: Payer: Self-pay | Admitting: Family Medicine

## 2013-11-15 ENCOUNTER — Ambulatory Visit (INDEPENDENT_AMBULATORY_CARE_PROVIDER_SITE_OTHER): Payer: BC Managed Care – PPO | Admitting: Family Medicine

## 2013-11-15 VITALS — BP 128/80 | HR 94 | Temp 98.2°F | Resp 17 | Ht 65.5 in | Wt 245.0 lb

## 2013-11-15 DIAGNOSIS — R062 Wheezing: Secondary | ICD-10-CM

## 2013-11-15 DIAGNOSIS — R059 Cough, unspecified: Secondary | ICD-10-CM

## 2013-11-15 DIAGNOSIS — R05 Cough: Secondary | ICD-10-CM

## 2013-11-15 DIAGNOSIS — J069 Acute upper respiratory infection, unspecified: Secondary | ICD-10-CM

## 2013-11-15 MED ORDER — ALBUTEROL SULFATE HFA 108 (90 BASE) MCG/ACT IN AERS: 2.0000 | INHALATION_SPRAY | RESPIRATORY_TRACT | Status: DC | PRN

## 2013-11-15 MED ORDER — HYDROCOD POLST-CHLORPHEN POLST 10-8 MG/5ML PO LQCR: 5.0000 mL | Freq: Every evening | ORAL | Status: DC | PRN

## 2013-11-15 NOTE — Patient Instructions (Signed)
Add zyrtec daily to help with nasal congestion Increase fluid intake- 8-10 glass daily

## 2013-11-15 NOTE — Progress Notes (Signed)
   Subjective:    Patient ID: Sherry Mercado, female    DOB: 02-21-1990, 23 y.o.   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 The patient presents today with several week history of nasal congestion. Was seen two weeks ago and was treated with amoxil 875 mg. She finished this and has had some improvement in congestion, but continues to have large amount of clear nasal drainage. She has started coughing in the last 3 days. She has coughing spasms with small amounts of white sputum. She has never smoked. She has taken some cold medication with temporary relief.  She has a "big sing," next week for school and is concerned that she will not be improved.   Review of Systems No fever, no chills, no headache, no ear pain. Some SOB with exertion- walking up hills and stairs. Some wheeze with cough.    Objective:   Physical Exam  Vitals reviewed. Constitutional: She is oriented to person, place, and time. She appears well-developed and well-nourished.  HENT:  Head: Normocephalic and atraumatic.  Right Ear: Tympanic membrane, external ear and ear canal normal.  Left Ear: Tympanic membrane, external ear and ear canal normal.  Nose: Mucosal edema and rhinorrhea present. Right sinus exhibits no maxillary sinus tenderness and no frontal sinus tenderness. Left sinus exhibits no maxillary sinus tenderness and no frontal sinus tenderness.  Mouth/Throat: Uvula is midline and mucous membranes are normal. Posterior oropharyngeal erythema present. No oropharyngeal exudate, posterior oropharyngeal edema or tonsillar abscesses.  Eyes: Conjunctivae are normal.  Neck: Normal range of motion. Neck supple.  Cardiovascular: Normal rate, regular rhythm and normal heart sounds.   Pulmonary/Chest: Effort normal and breath sounds normal. No respiratory distress. She has no wheezes. She has no rales.  Musculoskeletal: Normal range of  motion.  Lymphadenopathy:    She has no cervical adenopathy.  Neurological: She is alert and oriented to person, place, and time.  Skin: Skin is warm and dry.  Psychiatric: She has a normal mood and affect. Her behavior is normal. Judgment and thought content normal.       Assessment & Plan:  1. Cough -this is likely viral given no fever/chills, no purulent drainage/sputum in a healthy, non-smoker. -Discussed more aggressive symptom treatment and if no improvement in 3-4 days, she is to call and we can discuss whether an antibiotic is needed.  - albuterol (PROVENTIL HFA;VENTOLIN HFA) 108 (90 BASE) MCG/ACT inhaler; Inhale 2 puffs into the lungs every 4 (four) hours as needed for wheezing or shortness of breath (cough, shortness of breath or wheezing.).  Dispense: 1 Inhaler; Refill: 1 - chlorpheniramine-HYDROcodone (TUSSIONEX PENNKINETIC ER) 10-8 MG/5ML LQCR; Take 5 mLs by mouth at bedtime as needed.  Dispense: 70 mL; Refill: 0  2. Wheeze - albuterol (PROVENTIL HFA;VENTOLIN HFA) 108 (90 BASE) MCG/ACT inhaler; Inhale 2 puffs into the lungs every 4 (four) hours as needed for wheezing or shortness of breath (cough, shortness of breath or wheezing.).  Dispense: 1 Inhaler; Refill: 1   Emi Belfast, FNP-BC  [facility], [facility]  11/15/2013 8:25 PM

## 2013-11-17 ENCOUNTER — Encounter: Payer: Self-pay | Admitting: Family Medicine

## 2014-02-04 ENCOUNTER — Other Ambulatory Visit: Payer: Self-pay | Admitting: Physician Assistant

## 2014-02-04 NOTE — Telephone Encounter (Signed)
Sherry AnaKurt- not sure if you want to refill her celexa.  Looks like you saw her for depression over a year ago.  Request sent to me but I have never seen her Thanks!  JC

## 2014-02-04 NOTE — Telephone Encounter (Signed)
Dr. Patsy Lageropland,  Do you want to refill this prescription for Diabeta?

## 2014-02-05 ENCOUNTER — Other Ambulatory Visit: Payer: Self-pay | Admitting: Family Medicine

## 2014-02-05 MED ORDER — CITALOPRAM HYDROBROMIDE 40 MG PO TABS: ORAL_TABLET | ORAL | Status: DC

## 2014-02-18 DIAGNOSIS — E282 Polycystic ovarian syndrome: Secondary | ICD-10-CM

## 2014-02-18 HISTORY — DX: Polycystic ovarian syndrome: E28.2

## 2014-03-28 ENCOUNTER — Encounter: Payer: Self-pay | Admitting: Family Medicine

## 2014-03-28 ENCOUNTER — Ambulatory Visit (INDEPENDENT_AMBULATORY_CARE_PROVIDER_SITE_OTHER): Payer: BLUE CROSS/BLUE SHIELD | Admitting: Family Medicine

## 2014-03-28 VITALS — BP 125/85 | HR 100 | Temp 99.0°F | Resp 16 | Ht 65.5 in | Wt 247.0 lb

## 2014-03-28 DIAGNOSIS — E669 Obesity, unspecified: Secondary | ICD-10-CM

## 2014-03-28 DIAGNOSIS — F329 Major depressive disorder, single episode, unspecified: Secondary | ICD-10-CM

## 2014-03-28 DIAGNOSIS — E162 Hypoglycemia, unspecified: Secondary | ICD-10-CM

## 2014-03-28 DIAGNOSIS — F419 Anxiety disorder, unspecified: Secondary | ICD-10-CM

## 2014-03-28 DIAGNOSIS — L680 Hirsutism: Secondary | ICD-10-CM

## 2014-03-28 DIAGNOSIS — F418 Other specified anxiety disorders: Secondary | ICD-10-CM

## 2014-03-28 DIAGNOSIS — G4710 Hypersomnia, unspecified: Secondary | ICD-10-CM

## 2014-03-28 DIAGNOSIS — N912 Amenorrhea, unspecified: Secondary | ICD-10-CM

## 2014-03-28 DIAGNOSIS — L700 Acne vulgaris: Secondary | ICD-10-CM

## 2014-03-28 LAB — CBC WITH DIFFERENTIAL/PLATELET
Basophils Absolute: 0 10*3/uL (ref 0.0–0.1)
Basophils Relative: 0 % (ref 0–1)
EOS ABS: 0.2 10*3/uL (ref 0.0–0.7)
Eosinophils Relative: 2 % (ref 0–5)
HEMATOCRIT: 43.4 % (ref 36.0–46.0)
HEMOGLOBIN: 15 g/dL (ref 12.0–15.0)
Lymphocytes Relative: 32 % (ref 12–46)
Lymphs Abs: 3 10*3/uL (ref 0.7–4.0)
MCH: 30.4 pg (ref 26.0–34.0)
MCHC: 34.6 g/dL (ref 30.0–36.0)
MCV: 87.9 fL (ref 78.0–100.0)
MONO ABS: 0.7 10*3/uL (ref 0.1–1.0)
MPV: 8.9 fL (ref 8.6–12.4)
Monocytes Relative: 7 % (ref 3–12)
Neutro Abs: 5.5 10*3/uL (ref 1.7–7.7)
Neutrophils Relative %: 59 % (ref 43–77)
Platelets: 322 10*3/uL (ref 150–400)
RBC: 4.94 MIL/uL (ref 3.87–5.11)
RDW: 13.6 % (ref 11.5–15.5)
WBC: 9.4 10*3/uL (ref 4.0–10.5)

## 2014-03-28 LAB — COMPREHENSIVE METABOLIC PANEL
ALK PHOS: 44 U/L (ref 39–117)
ALT: 55 U/L — ABNORMAL HIGH (ref 0–35)
AST: 37 U/L (ref 0–37)
Albumin: 4.6 g/dL (ref 3.5–5.2)
BUN: 9 mg/dL (ref 6–23)
CALCIUM: 9.2 mg/dL (ref 8.4–10.5)
CHLORIDE: 104 meq/L (ref 96–112)
CO2: 23 mEq/L (ref 19–32)
Creat: 0.52 mg/dL (ref 0.50–1.10)
GLUCOSE: 74 mg/dL (ref 70–99)
Potassium: 3.9 mEq/L (ref 3.5–5.3)
SODIUM: 137 meq/L (ref 135–145)
Total Bilirubin: 1 mg/dL (ref 0.2–1.2)
Total Protein: 7.2 g/dL (ref 6.0–8.3)

## 2014-03-28 MED ORDER — MEDROXYPROGESTERONE ACETATE 10 MG PO TABS: 10.0000 mg | ORAL_TABLET | Freq: Every day | ORAL | Status: DC

## 2014-03-28 NOTE — Progress Notes (Signed)
Subjective:    Patient ID: Sherry Mercado, female    DOB: Sep 30, 1990, 24 y.o.   MRN: [id number]  03/28/2014  Establish care and Menstrual Problem   HPI This 24 y.o. female presents to establish care.  Last physical:  2015 Pap smear:  never TDAP:  Not sure Influenza: never Eye exam:  +contacts. Dental exam:  Every six months.  Anxiety and depression: compliance with Citalopram.  Currently has a very sick cat thus mood down due to ill pet.  No SI.  Blood sugar issues?:  If have not eaten in several/couple hours, gets faint, dizzy, light-headed, weakness.  Drinking a soda resolves symptoms.  Can lead to problem with attention in class.  Occurs randomly; hits a lot in afternoons.  Eats breakfast (bagel or cereal, juice; no protein).  Lunch: 1:00pm Lean cuisine or crackers with hummus, lemonade sugar free.  Snack:  None  Supper: eats around 8:00pm.  Varies a lot ---- chicken, macaroni, chicken and rice or spaghetti, soda.  Snack:  Usually; candy or fruit or biscuit.    Amenorrhea: has not had a period in five years.  Has a few friends with PCOS or something else.  Onset of menarche age 31.  Irregular menses for 1-2 years; regular menses for each month throughout high school.  Then summer after high school, menses stopped.  Previous sexual activity; 3 months ago sexual activity.  Dates both males and females; female last three months ago.  No STDs.  After high school, weight has increased. +facial hair; hairy person for past two years.  Acne is chronic issue.    Hypersomnolence: sleeps a lot with a lot of naps.  Bedtime is normally 11:00pm or 12:am; wakes up at 8:00am or 9:00am.  Napping in between classes; sleeps 1/2-1 hour per day.   Onset one year ago.  +snores.  Awful snoring. No apnea.  Does have problem breathing through nose.     Review of Systems  Constitutional: Positive for fatigue and unexpected weight change. Negative for fever, chills and diaphoresis.  Eyes: Negative for visual  disturbance.  Respiratory: Negative for cough and shortness of breath.   Cardiovascular: Negative for chest pain, palpitations and leg swelling.  Gastrointestinal: Negative for nausea, vomiting, abdominal pain, diarrhea and constipation.  Endocrine: Negative for cold intolerance, heat intolerance, polydipsia, polyphagia and polyuria.  Genitourinary: Positive for menstrual problem.  Neurological: Negative for dizziness, tremors, seizures, syncope, facial asymmetry, speech difficulty, weakness, light-headedness, numbness and headaches.  Psychiatric/Behavioral: Positive for dysphoric mood. Negative for suicidal ideas, sleep disturbance and self-injury. The patient is nervous/anxious.     Past Medical History  Diagnosis Date  . Depression   . Anxiety   . Allergy     Zyrtec PRN   Past Surgical History  Procedure Laterality Date  . Eye surgery      tear duct procedure   No Known Allergies History   Social History  . Marital Status: Single    Spouse Name: N/A  . Number of Children: N/A  . Years of Education: N/A   Occupational History  . Not on file.   Social History Main Topics  . Smoking status: Never Smoker   . Smokeless tobacco: Not on file  . Alcohol Use: 0.0 oz/week    0 Shots of liquor per week     Comment: social  . Drug Use: No  . Sexual Activity:    Partners: Male    Birth Control/ Protection: Condom   Other Topics Concern  .  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      Tobacco: none      Alcohol: socially      Drugs: none   Family History  Problem Relation Age of Onset  . Hearing loss Mother   . Bipolar disorder Mother   . Depression Mother   . Anxiety disorder Mother   . Bipolar disorder Brother   . Anxiety disorder Brother   . Cancer Maternal Grandmother     breast  cancer  . Bipolar disorder Maternal Grandmother   . Depression Maternal Grandmother   . Hearing loss Maternal Grandfather   . Diabetes Father         Objective:    BP 125/85 mmHg  Pulse 100  Temp(Src) 99 F (37.2 C) (Oral)  Resp 16  Ht 5' 5.5" (1.664 m)  Wt 247 lb (112.038 kg)  BMI 40.46 kg/m2  SpO2 99% Physical Exam  Constitutional: She is oriented to person, place, and time. She appears well-developed and well-nourished. No distress.  Obese; poor hygiene.  HENT:  Head: Normocephalic and atraumatic.  Right Ear: External ear normal.  Left Ear: External ear normal.  Nose: Nose normal.  Mouth/Throat: Oropharynx is clear and moist.  Eyes: Conjunctivae and EOM are normal. Pupils are equal, round, and reactive to light.  Neck: Normal range of motion. Neck supple. Carotid bruit is not present. No thyromegaly present.  Cardiovascular: Normal rate, regular rhythm, normal heart sounds and intact distal pulses.  Exam reveals no gallop and no friction rub.   No murmur heard. Pulmonary/Chest: Effort normal and breath sounds normal. She has no wheezes. She has no rales.  Abdominal: Soft. Bowel sounds are normal. She exhibits no distension and no mass. There is no tenderness. There is no rebound and no guarding.  Lymphadenopathy:    She has no cervical adenopathy.  Neurological: She is alert and oriented to person, place, and time. No cranial nerve deficit.  Skin: Skin is warm and dry. No rash noted. She is not diaphoretic. No erythema. No pallor.  Excessive hair on extremities and lower abdomen.  +multiple comedones facial with scattered cystic acne.  Psychiatric: She has a normal mood and affect. Her behavior is normal.        Assessment & Plan:   1. Amenorrhea   2. Hypoglycemia   3. Hypersomnolence   4. Obesity   5. Anxiety and depression   6. Hirsutism   7. Acne vulgaris    1. Amenorrhea:  New. Last menses over four years ago; obtain labs to evaluate for PCOS due to  obesity, hirsutism, acne. Rx for Provera 10mg  provided to trigger a withdraw bleed.  May warrant referral to endocrinology if labs abnormal. 2.  Hypoglycemia: New. Recommend frequent high protein meals throughout the day.   3.  Hypersomnolence:  New. Concerning for OSA.  Recommend weight loss, exercise, low-fat and low-caloric food choices.  Obtain labs; consider sleep study to evaluate for OSA. 4.  Obesity: worsening since high school; highly recommend weight loss and exercise. 5. Anxiety and depression: stable at this time; continue current medications. 6.  Hirsutism: New in past four years; obtain labs to evaluate for PCOS or other pathology. 7.  Acne vulgaris: New.  Obtain labs; consider  topical agents; may benefit from OCP and Spironolactone.   Meds ordered this encounter  Medications  . medroxyPROGESTERone (PROVERA) 10 MG tablet    Sig: Take 1 tablet (10 mg total) by mouth daily.    Dispense:  10 tablet    Refill:  0    Return in about 3 weeks (around 04/18/2014) for recheck.     Zani Kyllonen Paulita Fujita, M.D. [facility] [address] [place], [place]  [address] [phone] phone [phone] fax

## 2014-03-28 NOTE — Patient Instructions (Signed)
Hypoglycemia °Hypoglycemia occurs when the glucose in your blood is too low. Glucose is a type of sugar that is your body's main energy source. Hormones, such as insulin and glucagon, control the level of glucose in the blood. Insulin lowers blood glucose and glucagon increases blood glucose. Having too much insulin in your blood stream, or not eating enough food containing sugar, can result in hypoglycemia. Hypoglycemia can happen to people with or without diabetes. It can develop quickly and can be a medical emergency.  °CAUSES  °· Missing or delaying meals. °· Not eating enough carbohydrates at meals. °· Taking too much diabetes medicine. °· Not timing your oral diabetes medicine or insulin doses with meals, snacks, and exercise. °· Nausea and vomiting. °· Certain medicines. °· Severe illnesses, such as hepatitis, kidney disorders, and certain eating disorders. °· Increased activity or exercise without eating something extra or adjusting medicines. °· Drinking too much alcohol. °· A nerve disorder that affects body functions like your heart rate, blood pressure, and digestion (autonomic neuropathy). °· A condition where the stomach muscles do not function properly (gastroparesis). Therefore, medicines and food may not absorb properly. °· Rarely, a tumor of the pancreas can produce too much insulin. °SYMPTOMS  °· Hunger. °· Sweating (diaphoresis). °· Change in body temperature. °· Shakiness. °· Headache. °· Anxiety. °· Lightheadedness. °· Irritability. °· Difficulty concentrating. °· Dry mouth. °· Tingling or numbness in the hands or feet. °· Restless sleep or sleep disturbances. °· Altered speech and coordination. °· Change in mental status. °· Seizures or prolonged convulsions. °· Combativeness. °· Drowsiness (lethargic). °· Weakness. °· Increased heart rate or palpitations. °· Confusion. °· Pale, gray skin color. °· Blurred or double vision. °· Fainting. °DIAGNOSIS  °A physical exam and medical history will be  performed. Your caregiver may make a diagnosis based on your symptoms. Blood tests and other lab tests may be performed to confirm a diagnosis. Once the diagnosis is made, your caregiver will see if your signs and symptoms go away once your blood glucose is raised.  °TREATMENT  °Usually, you can easily treat your hypoglycemia when you notice symptoms. °· Check your blood glucose. If it is less than 70 mg/dl, take one of the following:   °¨ 3-4 glucose tablets.   °¨ ½ cup juice.   °¨ ½ cup regular soda.   °¨ 1 cup skim milk.   °¨ ½-1 tube of glucose gel.   °¨ 5-6 hard candies.   °· Avoid high-fat drinks or food that may delay a rise in blood glucose levels. °· Do not take more than the recommended amount of sugary foods, drinks, gel, or tablets. Doing so will cause your blood glucose to go too high.   °· Wait 10-15 minutes and recheck your blood glucose. If it is still less than 70 mg/dl or below your target range, repeat treatment.   °· Eat a snack if it is more than 1 hour until your next meal.   °There may be a time when your blood glucose may go so low that you are unable to treat yourself at home when you start to notice symptoms. You may need someone to help you. You may even faint or be unable to swallow. If you cannot treat yourself, someone will need to bring you to the hospital.  °HOME CARE INSTRUCTIONS °· If you have diabetes, follow your diabetes management plan by: °¨ Taking your medicines as directed. °¨ Following your exercise plan. °¨ Following your meal plan. Do not skip meals. Eat on time. °¨ Testing your blood   glucose regularly. Check your blood glucose before and after exercise. If you exercise longer or different than usual, be sure to check blood glucose more frequently. °¨ Wearing your medical alert jewelry that says you have diabetes. °· Identify the cause of your hypoglycemia. Then, develop ways to prevent the recurrence of hypoglycemia. °· Do not take a hot bath or shower right after an  insulin shot. °· Always carry treatment with you. Glucose tablets are the easiest to carry. °· If you are going to drink alcohol, drink it only with meals. °· Tell friends or family members ways to keep you safe during a seizure. This may include removing hard or sharp objects from the area or turning you on your side. °· Maintain a healthy weight. °SEEK MEDICAL CARE IF:  °· You are having problems keeping your blood glucose in your target range. °· You are having frequent episodes of hypoglycemia. °· You feel you might be having side effects from your medicines. °· You are not sure why your blood glucose is dropping so low. °· You notice a change in vision or a new problem with your vision. °SEEK IMMEDIATE MEDICAL CARE IF:  °· Confusion develops. °· A change in mental status occurs. °· The inability to swallow develops. °· Fainting occurs. °Document Released: 02/04/2005 Document Revised: 02/09/2013 Document Reviewed: 06/03/2011 °ExitCare® Patient Information ©2015 [employer]. This information is not intended to replace advice given to you by your health care provider. Make sure you discuss any questions you have with your health care provider. ° °

## 2014-03-29 LAB — TSH: TSH: 3.93 u[IU]/mL (ref 0.350–4.500)

## 2014-03-29 LAB — FSH/LH
FSH: 4.2 m[IU]/mL
LH: 5.7 m[IU]/mL

## 2014-03-29 LAB — TESTOSTERONE: Testosterone: 84 ng/dL — ABNORMAL HIGH (ref 10–70)

## 2014-03-29 LAB — PROLACTIN: Prolactin: 7.8 ng/mL

## 2014-03-29 LAB — HEMOGLOBIN A1C
Hgb A1c MFr Bld: 5.4 % (ref <5.7)
MEAN PLASMA GLUCOSE: 108 mg/dL (ref <117)

## 2014-04-11 ENCOUNTER — Telehealth: Payer: Self-pay

## 2014-04-11 NOTE — Telephone Encounter (Signed)
Something has happened since the pe 12 days ago and mom wants to discuss it with a nurse or provider.   [phone]

## 2014-04-12 ENCOUNTER — Encounter: Payer: Self-pay | Admitting: Family Medicine

## 2014-04-13 NOTE — Telephone Encounter (Signed)
Unable to leave message on VM

## 2014-04-15 NOTE — Telephone Encounter (Signed)
Called mom again today. No Vm to leave message.

## 2014-04-21 ENCOUNTER — Other Ambulatory Visit: Payer: BLUE CROSS/BLUE SHIELD

## 2014-05-11 ENCOUNTER — Ambulatory Visit: Payer: BLUE CROSS/BLUE SHIELD | Admitting: Family Medicine

## 2014-06-13 ENCOUNTER — Ambulatory Visit (INDEPENDENT_AMBULATORY_CARE_PROVIDER_SITE_OTHER): Payer: BLUE CROSS/BLUE SHIELD | Admitting: Family Medicine

## 2014-06-13 ENCOUNTER — Encounter: Payer: Self-pay | Admitting: Family Medicine

## 2014-06-13 VITALS — BP 130/76 | HR 80 | Temp 98.1°F | Resp 18 | Ht 66.5 in | Wt 251.8 lb

## 2014-06-13 DIAGNOSIS — E349 Endocrine disorder, unspecified: Secondary | ICD-10-CM | POA: Diagnosis not present

## 2014-06-13 DIAGNOSIS — G4710 Hypersomnia, unspecified: Secondary | ICD-10-CM

## 2014-06-13 DIAGNOSIS — L680 Hirsutism: Secondary | ICD-10-CM

## 2014-06-13 DIAGNOSIS — R0683 Snoring: Secondary | ICD-10-CM

## 2014-06-13 DIAGNOSIS — R7989 Other specified abnormal findings of blood chemistry: Secondary | ICD-10-CM

## 2014-06-13 DIAGNOSIS — R945 Abnormal results of liver function studies: Secondary | ICD-10-CM

## 2014-06-13 DIAGNOSIS — N926 Irregular menstruation, unspecified: Secondary | ICD-10-CM | POA: Diagnosis not present

## 2014-06-13 LAB — COMPREHENSIVE METABOLIC PANEL
ALBUMIN: 4.2 g/dL (ref 3.5–5.2)
ALT: 45 U/L — AB (ref 0–35)
AST: 32 U/L (ref 0–37)
Alkaline Phosphatase: 48 U/L (ref 39–117)
BILIRUBIN TOTAL: 1 mg/dL (ref 0.2–1.2)
BUN: 7 mg/dL (ref 6–23)
CALCIUM: 9.5 mg/dL (ref 8.4–10.5)
CHLORIDE: 104 meq/L (ref 96–112)
CO2: 23 mEq/L (ref 19–32)
CREATININE: 0.6 mg/dL (ref 0.50–1.10)
GLUCOSE: 155 mg/dL — AB (ref 70–99)
POTASSIUM: 3.9 meq/L (ref 3.5–5.3)
SODIUM: 136 meq/L (ref 135–145)
Total Protein: 7.1 g/dL (ref 6.0–8.3)

## 2014-06-13 LAB — POCT URINALYSIS DIPSTICK
BILIRUBIN UA: NEGATIVE
GLUCOSE UA: NEGATIVE
Ketones, UA: NEGATIVE
Nitrite, UA: NEGATIVE
Protein, UA: NEGATIVE
RBC UA: NEGATIVE
Spec Grav, UA: >=1.03
UROBILINOGEN UA: 0.2
pH, UA: 6

## 2014-06-13 LAB — POCT URINE PREGNANCY: Preg Test, Ur: NEGATIVE

## 2014-06-13 MED ORDER — NORETHINDRONE ACET-ETHINYL EST 1.5-30 MG-MCG PO TABS: 1.0000 | ORAL_TABLET | Freq: Every day | ORAL | Status: DC

## 2014-06-13 MED ORDER — FLUOXETINE HCL 20 MG PO TABS: 20.0000 mg | ORAL_TABLET | Freq: Every day | ORAL | Status: DC

## 2014-06-13 NOTE — Patient Instructions (Signed)
1. Wean Citalopram 40mg  to 1/2 tablet daily for two weeks; then decrease to 1/2 tablet every other day for two weeks, and then STOP. 2.  Start Fluoxetine 20mg  one tablet daily for anxiety and depression.

## 2014-06-13 NOTE — Progress Notes (Signed)
Subjective:    Patient ID: Sherry Mercado, female    DOB: 06-Jul-1990, 24 y.o.   MRN: [id number]  06/13/2014  follow up amenorrhea; Anxiety; and Snoring   HPI  This 24 y.o. female presents for evaluation two month follow-up:  1.  Amenorrhea:  Management changes made at last visit included adding Provera; did have a withdraw bleed for seven days; very heavy.  No recurrent menses since then.   TSH normal; Testosterone was elevated; has not returned to repeat.  Must set a date.   2.  Hypersomnolence:  Agreeable to sleep study.  Bedtime usually 11:00-12:00am. First class is at 11:00am.  Gets up at 8:00-10:00am.  Taking a nap a lot.    3.  Anxiety and depression:  No SI.  Continues to get extremely anxious, grumpy, angry.  Does not lash out.  Can be very dramatic.  Has been taking Citalopram since high school.  No other medication; previous ADHD medication.  Previous psychiatrist; willing to try seeing psychiatrist.  Dad takes Citalopram.  Mother takes something different; brother takes something different.  Delaying graduate school.  Dad is Retail banker.  Graduates next week.  Plans to delay graduate school.  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  Review of Systems  Constitutional: Positive for activity change. Negative for fever, chills, diaphoresis and fatigue.  Eyes: Negative for visual disturbance.  Respiratory: Negative for cough and shortness of breath.   Cardiovascular: Negative for chest pain, palpitations and leg swelling.  Gastrointestinal: Negative for nausea, vomiting, abdominal pain, diarrhea and constipation.  Endocrine: Negative for cold intolerance, heat intolerance, polydipsia, polyphagia and polyuria.  Genitourinary: Positive for menstrual problem.  Neurological: Negative for dizziness, tremors, seizures, syncope, facial asymmetry, speech difficulty, weakness, light-headedness, numbness and headaches.  Psychiatric/Behavioral: Positive for dysphoric mood. Negative for suicidal  ideas, behavioral problems, confusion, sleep disturbance, self-injury, decreased concentration and agitation. The patient is nervous/anxious.     Past Medical History  Diagnosis Date  . Depression   . Anxiety   . Allergy     Zyrtec PRN   Past Surgical History  Procedure Laterality Date  . Eye surgery      tear duct procedure   No Known Allergies      Objective:    BP 130/76 mmHg  Pulse 80  Temp(Src) 98.1 F (36.7 C) (Oral)  Resp 18  Ht 5' 6.5" (1.689 m)  Wt 251 lb 12.8 oz (114.216 kg)  BMI 40.04 kg/m2  SpO2 97% Physical Exam  Constitutional: She is oriented to person, place, and time. She appears well-developed and well-nourished. No distress.  obese  HENT:  Head: Normocephalic and atraumatic.  Right Ear: External ear normal.  Left Ear: External ear normal.  Nose: Nose normal.  Mouth/Throat: Oropharynx is clear and moist.  Eyes: Conjunctivae and EOM are normal. Pupils are equal, round, and reactive to light.  Neck: Normal range of motion. Neck supple. Carotid bruit is not present. No thyromegaly present.  Cardiovascular: Normal rate, regular rhythm, normal heart sounds and intact distal pulses.  Exam reveals no gallop and no friction rub.   No murmur heard. Pulmonary/Chest: Effort normal and breath sounds normal. She has no wheezes. She has no rales.  Abdominal: Soft. Bowel sounds are normal. She exhibits no distension and no mass. There is no tenderness. There is no rebound and no guarding.  Lymphadenopathy:    She has no cervical adenopathy.  Neurological: She is alert and oriented to person, place, and time. No cranial nerve deficit.  Skin: Skin is warm and dry. No rash noted. She is not diaphoretic. No erythema. No pallor.  Scattered acne facially; +excessive hair growth arms.  Psychiatric: She has a normal mood and affect. Her behavior is normal.        Assessment & Plan:   1. Irregular menses   2. Hirsutism   3. Elevated LFTs   4. Snoring   5.  Hypersomnolence   6. Elevated testosterone level in female     1. Irregular menses: persistent; had a successful withdraw bleed with Provera.  Rx for OCP provided.   2.  Hirsutism:  New.  Associated with elevated testosterone level, irregular menses, acne; obtain abdominal us to evaluate adrenal glands.  Also obtain pelvic us.  Consider Spironoloctone. 3.  Elevated LFTs: New.  Repeat today; obtain abdominal us. 4.  Snoring: Persistent; associated with obesity.  Refer for sleep study. 5.  Elevated testosterone level: New. Repeat today; obtain additional labs.  May warrant endocrinology evaluation.   6. Anxiety disorder: uncontrolled; wean Citalopram over next month; start Fluoxetine 20mg  daily.  Consider psychiatry referral at next visit.  Strong family history of bipolar disorder.  7. Obesity: highly recommend weight loss, exercise, low-calorie food choices.       Meds ordered this encounter  Medications  . FLUoxetine (PROZAC) 20 MG tablet    Sig: Take 1 tablet (20 mg total) by mouth daily.    Dispense:  30 tablet    Refill:  3  . Norethindrone Acetate-Ethinyl Estradiol (JUNEL,LOESTRIN,MICROGESTIN) 1.5-30 MG-MCG tablet    Sig: Take 1 tablet by mouth daily.    Dispense:  1 Package    Refill:  11    Return in about 4 weeks (around 07/11/2014) for recheck at 8:00am.     Cecilie LowersKristi Martin Kieli Golladay, M.D. [facility]  [place] [address] [place], [place]  [id number] [phone] phone [phone] fax

## 2014-06-14 ENCOUNTER — Other Ambulatory Visit: Payer: Self-pay | Admitting: Family Medicine

## 2014-06-14 DIAGNOSIS — R945 Abnormal results of liver function studies: Secondary | ICD-10-CM

## 2014-06-14 DIAGNOSIS — R7989 Other specified abnormal findings of blood chemistry: Secondary | ICD-10-CM

## 2014-06-14 DIAGNOSIS — L680 Hirsutism: Secondary | ICD-10-CM

## 2014-06-14 DIAGNOSIS — N926 Irregular menstruation, unspecified: Secondary | ICD-10-CM

## 2014-06-16 LAB — 17-HYDROXYPROGESTERONE: 17-OH-PROGESTERONE, LC/MS/MS: 38 ng/dL

## 2014-06-24 ENCOUNTER — Encounter: Payer: Self-pay | Admitting: *Deleted

## 2014-06-27 ENCOUNTER — Encounter: Payer: Self-pay | Admitting: Family Medicine

## 2014-06-27 ENCOUNTER — Ambulatory Visit (INDEPENDENT_AMBULATORY_CARE_PROVIDER_SITE_OTHER): Payer: BC Managed Care – PPO | Admitting: Family Medicine

## 2014-06-27 ENCOUNTER — Other Ambulatory Visit: Payer: Self-pay | Admitting: Family Medicine

## 2014-06-27 VITALS — BP 128/80 | HR 90 | Temp 98.6°F | Resp 16 | Ht 65.5 in | Wt 250.8 lb

## 2014-06-27 DIAGNOSIS — L680 Hirsutism: Secondary | ICD-10-CM | POA: Diagnosis not present

## 2014-06-27 DIAGNOSIS — N926 Irregular menstruation, unspecified: Secondary | ICD-10-CM | POA: Diagnosis not present

## 2014-06-27 DIAGNOSIS — E349 Endocrine disorder, unspecified: Secondary | ICD-10-CM

## 2014-06-27 DIAGNOSIS — F329 Major depressive disorder, single episode, unspecified: Secondary | ICD-10-CM

## 2014-06-27 DIAGNOSIS — F418 Other specified anxiety disorders: Secondary | ICD-10-CM | POA: Diagnosis not present

## 2014-06-27 DIAGNOSIS — F419 Anxiety disorder, unspecified: Secondary | ICD-10-CM

## 2014-06-27 LAB — CORTISOL: CORTISOL PLASMA: 19 ug/dL

## 2014-06-27 LAB — TESTOSTERONE: TESTOSTERONE: 93 ng/dL — AB (ref 10–70)

## 2014-06-27 NOTE — Progress Notes (Signed)
Subjective:    Patient ID: Sherry Mercado, female    DOB: [birth year], 24 y.o.   MRN: [id number]  HPI  Sherry Mercado is a 24 year old female who presents today for follow-up of irregular menses and lab work.   She was seen here on 06/13/14 with elevated testosterone level and was told to follow-up so further blood work could be drawn in the morning.   Recently graduated (06/25/14) from [employer]. She has noted increased stress recently in regards to final exams. As a result of her busy schedule with school, she has not been able to schedule a sleep study or have the abdominal/pelvic ultrasound done that were talked about at her last visit. She is still snoring heavily at night and waking up in the morning feeling exhausted and tired throughout the day.   She isn't sure that her medications are working properly. At her last visit she was prescribed Junel OCP and Fluoxetine for anxiety. She has noticed increased grumpiness and irritability recently, but feels this could also be related to stress from school and life in general. She is weaning off the Citalopram and today started ttaking only a half tablet, every other day. She denies any side effects from the Fluoxetine or Junel. She has also noticed increased acne on her face, but admits this could be from lack of healthy diet and exercise over the last 2 weeks. She has been getting occasional headaches over the last week, but feels this could also be related to stress and school. She takes Advil or Tylenol and the headache resolves. Denies any lightheadedness or dizziness.  She is planning on going to graduate school in [place] to get a masters degree in music. She is unsure if she will attend school this fall or defer and go to school next fall.   Review of Systems  Constitutional: Negative for fever, chills, diaphoresis and fatigue.  HENT: Negative for congestion, postnasal drip, sneezing and sore throat.   Eyes: Negative.     Respiratory: Negative for cough and shortness of breath.   Cardiovascular: Negative for chest pain and palpitations.  Gastrointestinal: Negative for nausea, vomiting, diarrhea and constipation.  Genitourinary: Negative.   Musculoskeletal: Negative.   Neurological: Positive for headaches (Stress, school-related). Negative for dizziness and light-headedness.  Psychiatric/Behavioral: Negative for suicidal ideas. The patient is nervous/anxious.        Objective:   Physical Exam  Constitutional: She is oriented to person, place, and time. She appears well-developed and well-nourished. No distress.  BP 128/80 mmHg  Pulse 90  Temp(Src) 98.6 F (37 C) (Oral)  Resp 16  Ht 5' 5.5" (1.664 m)  Wt 250 lb 12.8 oz (113.762 kg)  BMI 41.09 kg/m2  SpO2 98%   HENT:  Head: Normocephalic and atraumatic.  Right Ear: External ear normal.  Left Ear: External ear normal.  Eyes: Conjunctivae are normal. Pupils are equal, round, and reactive to light. No scleral icterus.  Neck: Normal range of motion. Neck supple.  Cardiovascular: Normal rate, regular rhythm and normal heart sounds.  Exam reveals no gallop and no friction rub.   No murmur heard. Pulmonary/Chest: Effort normal and breath sounds normal. She has no wheezes. She has no rhonchi. She has no rales.  Lymphadenopathy:       Head (right side): No submental, no submandibular and no tonsillar adenopathy present.       Head (left side): No submental, no submandibular and no tonsillar adenopathy present.    She has  no cervical adenopathy.       Right: No supraclavicular adenopathy present.       Left: No supraclavicular adenopathy present.  Neurological: She is alert and oriented to person, place, and time.  Skin: Skin is warm and dry. No rash noted. No erythema.  Increased dark hair to forearms.  Psychiatric: She has a normal mood and affect. Her behavior is normal.       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 Hirsutism 3.  Irregular menses Lab work drawn first thing this morning. Decision was made to continue the Junel at the current dose, even though she is unsure it is working properly. She has had a lot going on recently, and the increased moodiness could be related to school and stress, instead of the medication. Will plan to see her back in 1 month to reassess medication effectiveness after her life settles down some from graduating.  - Testosterone - Testosterone, Free, Direct - DHEA-sulfate - Cortisol - Insulin-like growth factor  4. Anxiety and depression Discussed keeping her on Fluoxetine 20 mg daily. It is near impossible to tell at the current moment if her increased moodiness and anxiety are related to stress from school or if it is the medication. She agreed that it was too difficult to differentiate, as she is unsure herself. Decided on a referral to psychiatry as well. Will plan to see her back in 1 month to reassess at that time. - Ambulatory referral to Psychiatry

## 2014-06-28 LAB — DHEA-SULFATE: DHEA SO4: 190 ug/dL (ref 18–391)

## 2014-06-30 LAB — TESTOSTERONE, FREE, DIRECT: FREE TESTOSTERONE, DIRECT MEASURE: 2.5 pg/mL (ref <7.0)

## 2014-06-30 LAB — INSULIN-LIKE GROWTH FACTOR
IGF-I, LC/MS: 62 ng/mL — AB (ref 83–456)
Z-SCORE (FEMALE): -2.6 {STDV} — AB (ref ?–2.0)

## 2014-07-07 ENCOUNTER — Ambulatory Visit
Admission: RE | Admit: 2014-07-07 | Discharge: 2014-07-07 | Disposition: A | Discharge status: Home / Self Care | Payer: BC Managed Care – PPO | Source: Ambulatory Visit | Attending: Family Medicine | Admitting: Family Medicine

## 2014-07-07 DIAGNOSIS — L680 Hirsutism: Secondary | ICD-10-CM

## 2014-07-07 DIAGNOSIS — N926 Irregular menstruation, unspecified: Secondary | ICD-10-CM

## 2014-07-07 DIAGNOSIS — R7989 Other specified abnormal findings of blood chemistry: Secondary | ICD-10-CM

## 2014-07-07 DIAGNOSIS — R945 Abnormal results of liver function studies: Secondary | ICD-10-CM

## 2014-07-11 ENCOUNTER — Other Ambulatory Visit: Payer: Self-pay

## 2014-07-23 ENCOUNTER — Encounter: Payer: Self-pay | Admitting: Radiology

## 2014-08-29 ENCOUNTER — Ambulatory Visit (INDEPENDENT_AMBULATORY_CARE_PROVIDER_SITE_OTHER): Payer: BC Managed Care – PPO | Admitting: Internal Medicine

## 2014-08-29 ENCOUNTER — Encounter: Payer: Self-pay | Admitting: Internal Medicine

## 2014-08-29 VITALS — BP 108/62 | HR 86 | Temp 98.1°F | Resp 12 | Ht 66.25 in | Wt 294.4 lb

## 2014-08-29 DIAGNOSIS — E282 Polycystic ovarian syndrome: Secondary | ICD-10-CM

## 2014-08-29 LAB — LIPID PANEL
CHOL/HDL RATIO: 3
Cholesterol: 129 mg/dL (ref 0–200)
HDL: 45 mg/dL (ref >39.00)
LDL Cholesterol: 62 mg/dL (ref 0–99)
NonHDL: 84
TRIGLYCERIDES: 110 mg/dL (ref 0.0–149.0)
VLDL: 22 mg/dL (ref 0.0–40.0)

## 2014-08-29 LAB — HEMOGLOBIN A1C: Hgb A1c MFr Bld: 5.1 % (ref 4.6–6.5)

## 2014-08-29 LAB — TSH: TSH: 2.09 u[IU]/mL (ref 0.35–4.50)

## 2014-08-29 LAB — T4, FREE: Free T4: 0.91 ng/dL (ref 0.60–1.60)

## 2014-08-29 LAB — T3, FREE: T3, Free: 3.8 pg/mL (ref 2.3–4.2)

## 2014-08-29 MED ORDER — NORGESTIMATE-ETH ESTRADIOL 0.25-35 MG-MCG PO TABS: 1.0000 | ORAL_TABLET | Freq: Every day | ORAL | Status: DC

## 2014-08-29 MED ORDER — METFORMIN HCL 500 MG PO TABS: 1000.0000 mg | ORAL_TABLET | Freq: Two times a day (BID) | ORAL | Status: DC

## 2014-08-29 NOTE — Progress Notes (Signed)
Patient ID: Sherry Mercado, female   DOB: 06/21/1990, 24 y.o.   MRN: [id number]  HPI: Sherry Mercado is a 24 y.o. female, referred by Dr Nilda Simmer, for evaluation for amenorrhea, hirsutism  - ?PCOS. Patient is here with her mother, who offers part of the history.  Weight gain: - gained weight steadily since highschool ~70 lbs  - no steroid use - no weight loss meds - Meals: - Breakfast: none or bagel + cream cheese - Lunch: lean cuisine - Dinner: meat + veggies and starch - Snacks:  "a lot", carbs Drinks: regular soda - 1-3 a day - Diets tried: none - Exercise: none  Fertility/Menstrual cycles: - irregular menses on OCPs, then no menses since 2011 - on Provera at the beginning of this year >> had a period - menarche at 45 - no h/o ovarian cysts on 07/08/2014 - children: 0 - miscarriages: 0 - contraception: 0, tried a new one last month, then stopped as she thought it was affecting her mood  Acne: - since being a child - mostly face, back, chest >> Clean and Clear - Used ProActive >> tried it again >> sensitivity - did not see a dermatologist  Hirsutism: - especially since highschool  - face, arm, legs, lower abdomen, back - + shaving  Treatments tried: - did not try Metformin - did not try Spironolactone - did not try Vaniqua - not on OCPs, but was on them in highschool >> ? Brand >> no SEs  Other meds: - SSRIs: not anymore  Pertinent labs: Component     Latest Ref Rng 02/24/2012 03/28/2014 06/13/2014 06/27/2014  FSH      6.9 4.2    LH      18.2 5.7    IGF-I, LC/MS     83 - 456 ng/mL    62 (L)  Z-Score (Female)     -2.0-+2.0 SD    -2.6 (L)  TSH     0.350 - 4.500 uIU/mL 1.529 3.930    Prolactin      8.8 7.8    Testosterone     10 - 70 ng/dL  84 (H)  93 (H)  Preg Test, Ur        Negative   17-OH-Progesterone, LC/MS/MS        38   Free Testosterone, Direct     <7.0 pg/mL    2.5  DHEA-SO4     18 - 391 ug/dL    409  Cortisol, Plasma         19.0   -  Last thyroid tests: Lab Results  Component Value Date   TSH 3.930 03/28/2014   - Last set of lipids:    Component Value Date/Time   CHOL 152 11/09/2008 1005   TRIG 86.0 11/09/2008 1005   HDL 73.70 11/09/2008 1005   CHOLHDL 2 11/09/2008 1005   VLDL 17.2 11/09/2008 1005   LDLCALC 61 11/09/2008 1005   - Last HbA1c: Lab Results  Component Value Date   HGBA1C 5.4 03/28/2014   She has FH of DM in father.   She has h/o irregular menses in mother and infertility in aunt.   She snores >> will be investigated for OSA.   ROS: Constitutional: + weight gain, + fatigue, no subjective hyperthermia/hypothermia, + poor sleep Eyes: no blurry vision, no xerophthalmia ENT: no sore throat, no nodules palpated in throat, no dysphagia/odynophagia, no hoarseness Cardiovascular: no CP/+ SOB/no palpitations/+ leg swelling Respiratory: no cough/SOB Gastrointestinal: no N/V/+ D/no C Musculoskeletal:  no muscle/joint aches Skin: + acne, + hair on face, + stretch marks Neurological: no tremors/numbness/tingling/dizziness Psychiatric: + both: depression/anxiety + Low libido  Past Medical History  Diagnosis Date  . Depression   . Anxiety   . Allergy     Zyrtec PRN   Past Surgical History  Procedure Laterality Date  . Eye surgery      tear duct procedure   History   Social History  . Marital Status: Single    Spouse Name: N/A  . Number of Children: 0   Occupational History  .  student    Social History Main Topics  . Smoking status: Never Smoker   . Smokeless tobacco: Not on file  . Alcohol Use: 0.0 oz/week    0 Shots of liquor per week     Comment: social - wine, occasionally   . Drug Use: No  . Sexual Activity:    Partners: Male    Birth Control/ Protection: Condom   Social History Narrative   Marital status: single; not dating seriously.     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      Tobacco: none      Alcohol: socially      Drugs: none      Sexual activity: dates females and males.   Current Outpatient Prescriptions on File Prior to Visit  Medication Sig Dispense Refill  . FLUoxetine (PROZAC) 20 MG tablet Take 1 tablet (20 mg total) by mouth daily. 30 tablet 3  . albuterol (PROVENTIL HFA;VENTOLIN HFA) 108 (90 BASE) MCG/ACT inhaler Inhale 2 puffs into the lungs every 4 (four) hours as needed for wheezing or shortness of breath (cough, shortness of breath or wheezing.). (Patient not taking: Reported on 03/28/2014) 1 Inhaler 1  . citalopram (CELEXA) 40 MG tablet TAKE 1 TABLET (40 MG TOTAL) BY MOUTH DAILY. (Patient not taking: Reported on 08/29/2014) 90 tablet 3  . medroxyPROGESTERone (PROVERA) 10 MG tablet Take 1 tablet (10 mg total) by mouth daily. (Patient not taking: Reported on 06/13/2014) 10 tablet 0  . Norethindrone Acetate-Ethinyl Estradiol (JUNEL,LOESTRIN,MICROGESTIN) 1.5-30 MG-MCG tablet Take 1 tablet by mouth daily. (Patient not taking: Reported on 08/29/2014) 1 Package 11   No current facility-administered medications on file prior to visit.   Not on File Family History  Problem Relation Age of Onset  . Hearing loss Mother   . Bipolar disorder Mother   . Depression Mother   . Anxiety disorder Mother   . Bipolar disorder Brother   . Anxiety disorder Brother   . Cancer Maternal Grandmother     breast cancer  . Bipolar disorder Maternal Grandmother   . Depression Maternal Grandmother   . Hearing loss Maternal Grandfather   . Diabetes Father   . Anxiety disorder Father    PE: BP 108/62 mmHg  Pulse 86  Temp(Src) 98.1 F (36.7 C) (Oral)  Resp 12  Ht 5' 6.25" (1.683 m)  Wt 294 lb 6.4 oz (133.539 kg)  BMI 47.15 kg/m2  SpO2 97% Wt Readings from Last 3 Encounters:  08/29/14 294 lb 6.4 oz (133.539 kg)  06/27/14 250 lb 12.8 oz (113.762 kg)  06/13/14 251 lb 12.8 oz (114.216 kg)   Constitutional: overweight, in NAD,  no full supraclavicular fat pads Eyes: PERRLA, EOMI, no exophthalmos ENT: moist mucous membranes, no thyromegaly, no cervical  lymphadenopathy Cardiovascular: RRR, No MRG Respiratory: CTA B Gastrointestinal: abdomen soft, NT, ND, BS+ Musculoskeletal: no deformities, strength intact in all 4 Skin: moist, warm; + acne on face, chest and back, + dark terminal hair on chin, + vellum on sideburns, + skin tags, + acanthosis nigricans, no purple, wide, stretch marks Neurological: no tremor with outstretched hands, DTR normal in all 4  ASSESSMENT: 1. PCOS - likely  Amenorrhea - x4 years - responded to Provera challenge   Hirsutism   Acne  Obesity  PLAN: 1.  Amenorrhea, hirsutism, acne, obesity >> likely PCOS   I had a long discussion with the patient  and her mother about the fact that the PCOS is a misnomer, a patient does not necessarily have to have polycystic ovaries to be diagnosed with the disorder (she actually had a transvaginal ultrasound that did not show any ovarian cysts). PCOS  is of sum of several conditions, including:  weight gain  insulin resistance (and therefore a higher risk of developing diabetes later in life) -She has acanthosis nigricans and I explained that this is a sign of insulin resistance    acne  hirsutism  irregular menstrual cycles  decreased fertility  With prolonged a menorrhea, there is a concern for endometrial cancer - We also discussed about the fact that the treatment is usually targeted to addressing the problem that concerns the patient the most: acne/hirsutism, weight gain, or fertility, but there is no single treatment for PCOS.  - The first-line therapy are oral contraceptives. If she is concerned with her weight, we can use metformin; if she is concerned about acne/hirsutism, we can add spironolactone; and if she is concerned about fertility, I could refer her to reproductive endocrinology for possible use of clomiphene. - Patient would  not want to have children right away, but she is very concerned about her hirsutism, acne, and weight. She agrees to start oral contraception and I suggested to start Ortho-Cyclen, which is a third generation OCP, with lower DVT potential compared to the other third-generation OCPs. She agrees to try this. Also, she is interested in starting metformin to improve her weight gain and insulin resistance. - I suggested specialized nutrition advised with Denny Levy >> patient agrees.  We did discuss about the importance of diet to improve her weight and therefore her increased risk for diabetes, hyperlipidemia, heart disease. I suggested to start eating breakfast, to eliminate regular sodas, and to try to be mindful of all the foods that she eats. - Patient was recommended the website: QualityLasers.si - I will also check the following tests:  Orders Placed This Encounter  Procedures  . Lipid Profile  . HgB A1c  . Testosterone, Free, Total, SHBG  . TSH  . T4, free  . T3, free  . Amb ref to Medical Nutrition Therapy-MNT   - I will see the patient back in 6 months  Patient Instructions  Please start Metformin 500 mg with dinner x 4 days. If you tolerate this well, add another Metformin tablet (500 mg) with breakfast x 4 days. If you tolerate this well, add another metformin tablet with dinner (total 1000 mg) x 4 days. If you tolerate this well, add another metformin tablet with breakfast (total 1000 mg). Continue with 1000 mg of metformin 2x a day with breakfast and dinner.  Please start OrthoCyclen pills.  Please expect a call from Denny Levy (nutrition).  Please go on the website: [email or web address]  Please come back for a follow-up appointment in  6 months.  - time spent with the patient and her mother: 1 hour, of which >50% was spent in obtaining information about her symptoms, reviewing her previous labs, evaluations, and treatments, counseling her about her condition (please see the discussed  topics above), and developing a plan to further investigate it; she had a number of questions which I addressed.  Office Visit on 08/29/2014  Component Date Value Ref Range Status  . Cholesterol 08/29/2014 129  0 - 200 mg/dL Final   ATP III Classification       Desirable:  < 200 mg/dL               Borderline High:  200 - 239 mg/dL          High:  > = 161240 mg/dL  . Triglycerides 08/29/2014 110.0  0.0 - 149.0 mg/dL Final   Normal:  <096<150 mg/dLBorderline High:  150 - 199 mg/dL  . HDL 08/29/2014 45.00  >39.00 mg/dL Final  . VLDL 04/54/098107/12/2014 22.0  0.0 - 40.0 mg/dL Final  . LDL Cholesterol 08/29/2014 62  0 - 99 mg/dL Final  . Total CHOL/HDL Ratio 08/29/2014 3   Final                  Men          Women1/2 Average Risk     3.4          3.3Average Risk          5.0          4.42X Average Risk          9.6          7.13X Average Risk          15.0          11.0                      . NonHDL 08/29/2014 84.00   Final   NOTE:  Non-HDL goal should be 30 mg/dL higher than patient's LDL goal (i.e. LDL goal of < 70 mg/dL, would have non-HDL goal of < 100 mg/dL)  . Hgb A1c MFr Bld 08/29/2014 5.1  4.6 - 6.5 % Final   Glycemic Control Guidelines for People with Diabetes:Non Diabetic:  <6%Goal of Therapy: <7%Additional Action Suggested:  >8%   . Testosterone 08/29/2014 114* 10 - 70 ng/dL Final   Comment:           Tanner Stage       Female              Female               I              < 30 ng/dL        < 10 ng/dL               II             < 150 ng/dL       < 30 ng/dL               III            100-320 ng/dL     < 35 ng/dL               IV             200-970 ng/dL     19-1415-40 ng/dL  V/Adult        300-890 ng/dL     16-10 ng/dL     . Sex Hormone Binding 08/29/2014 15* 17 - 124 nmol/L Final  . Testosterone, Free 08/29/2014 30.8* 0.6 - 6.8 pg/mL Final   Comment:   The concentration of free testosterone is derived from a mathematical expression based on constants for the binding of testosterone  to sex hormone-binding globulin and albumin.   . Testosterone-% Free 08/29/2014 2.7* 0.4 - 2.4 % Final  . TSH 08/29/2014 2.09  0.35 - 4.50 uIU/mL Final  . Free T4 08/29/2014 0.91  0.60 - 1.60 ng/dL Final  . T3, Free 96/05/5407 3.8  2.3 - 4.2 pg/mL Final   Cholesterol levels, hemoglobin A1c, and thyroid tests are normal. Testosterone levels are high, and SHBG is low. These will be improved by oral contraceptives and metformin.

## 2014-08-29 NOTE — Patient Instructions (Addendum)
Please start Metformin 500 mg with dinner x 4 days. If you tolerate this well, add another Metformin tablet (500 mg) with breakfast x 4 days. If you tolerate this well, add another metformin tablet with dinner (total 1000 mg) x 4 days. If you tolerate this well, add another metformin tablet with breakfast (total 1000 mg). Continue with 1000 mg of metformin 2x a day with breakfast and dinner.  Please start OrthoCyclen pills.  Please expect a call from Denny Levy (nutrition).  Please go on the website: [email or web address]  Please come back for a follow-up appointment in 6 months.  Polycystic Ovarian Syndrome Polycystic ovarian syndrome (PCOS) is a common hormonal disorder among women of reproductive age. Most women with PCOS grow many small cysts on their ovaries. PCOS can cause problems with your periods and make it difficult to get pregnant. It can also cause an increased risk of miscarriage with pregnancy. If left untreated, PCOS can lead to serious health problems, such as diabetes and heart disease. CAUSES The cause of PCOS is not fully understood, but genetics may be a factor. SIGNS AND SYMPTOMS   Infrequent or no menstrual periods.   Inability to get pregnant (infertility) because of not ovulating.   Increased growth of hair on the face, chest, stomach, back, thumbs, thighs, or toes.   Acne, oily skin, or dandruff.   Pelvic pain.   Weight gain or obesity, usually carrying extra weight around the waist.   Type 2 diabetes.   High cholesterol.   High blood pressure.   Female-pattern baldness or thinning hair.   Patches of thickened and dark brown or black skin on the neck, arms, breasts, or thighs.   Tiny excess flaps of skin (skin tags) in the armpits or neck area.   Excessive snoring and having breathing stop at times while asleep (sleep apnea).   Deepening of the voice.   Gestational diabetes when pregnant.  DIAGNOSIS  There is no single test to diagnose  PCOS.   Your health care provider will:   Take a medical history.   Perform a pelvic exam.   Have ultrasonography done.   Check your female and female hormone levels.   Measure glucose or sugar levels in the blood.   Do other blood tests.   If you are producing too many female hormones, your health care provider will make sure it is from PCOS. At the physical exam, your health care provider will want to evaluate the areas of increased hair growth. Try to allow natural hair growth for a few days before the visit.   During a pelvic exam, the ovaries may be enlarged or swollen because of the increased number of small cysts. This can be seen more easily by using vaginal ultrasonography or screening to examine the ovaries and lining of the uterus (endometrium) for cysts. The uterine lining may become thicker if you have not been having a regular period.  TREATMENT  Because there is no cure for PCOS, it needs to be managed to prevent problems. Treatments are based on your symptoms. Treatment is also based on whether you want to have a baby or whether you need contraception.  Treatment may include:   Progesterone hormone to start a menstrual period.   Birth control pills to make you have regular menstrual periods.   Medicines to make you ovulate, if you want to get pregnant.   Medicines to control your insulin.   Medicine to control your blood pressure.   Medicine and diet  to control your high cholesterol and triglycerides in your blood.  Medicine to reduce excessive hair growth.  Surgery, making small holes in the ovary, to decrease the amount of female hormone production. This is done through a long, lighted tube (laparoscope) placed into the pelvis through a tiny incision in the lower abdomen.  HOME CARE INSTRUCTIONS  Only take over-the-counter or prescription medicine as directed by your health care provider.  Pay attention to the foods you eat and your activity  levels. This can help reduce the effects of PCOS.  Keep your weight under control.  Eat foods that are low in carbohydrate and high in fiber.  Exercise regularly. SEEK MEDICAL CARE IF:  Your symptoms do not get better with medicine.  You have new symptoms. Document Released: 05/31/2004 Document Revised: 11/25/2012 Document Reviewed: 07/23/2012 [facility] Patient Information 2015 [employer] [place]. This information is not intended to replace advice given to you by your health care provider. Make sure you discuss any questions you have with your health care provider.

## 2014-08-30 LAB — TESTOSTERONE, FREE, TOTAL, SHBG
SEX HORMONE BINDING: 15 nmol/L — AB (ref 17–124)
Testosterone, Free: 30.8 pg/mL — ABNORMAL HIGH (ref 0.6–6.8)
Testosterone-% Free: 2.7 % — ABNORMAL HIGH (ref 0.4–2.4)
Testosterone: 114 ng/dL — ABNORMAL HIGH (ref 10–70)

## 2014-08-31 ENCOUNTER — Encounter: Payer: Self-pay | Admitting: *Deleted

## 2014-10-29 ENCOUNTER — Other Ambulatory Visit: Payer: Self-pay | Admitting: Family Medicine

## 2014-11-14 ENCOUNTER — Other Ambulatory Visit: Payer: Self-pay | Admitting: Family Medicine

## 2014-12-01 ENCOUNTER — Other Ambulatory Visit: Payer: Self-pay | Admitting: Physician Assistant

## 2014-12-23 ENCOUNTER — Ambulatory Visit: Payer: BC Managed Care – PPO | Admitting: Family Medicine

## 2015-03-01 ENCOUNTER — Ambulatory Visit: Payer: BC Managed Care – PPO | Admitting: Internal Medicine

## 2015-04-07 ENCOUNTER — Other Ambulatory Visit: Payer: Self-pay | Admitting: Family Medicine

## 2015-05-11 ENCOUNTER — Other Ambulatory Visit: Payer: Self-pay | Admitting: Family Medicine

## 2015-05-12 ENCOUNTER — Telehealth: Payer: Self-pay

## 2015-05-12 NOTE — Telephone Encounter (Signed)
I sent in another 2 wks RF and father was given this info by Laqueta DueKaya, and that pt needs OV for more.

## 2015-05-12 NOTE — Telephone Encounter (Signed)
Patient's father called because patient ran out of prozac and is having withdrawal symptoms. I spoke to [place] and informed patient's father that patient will need an office visit in order to continue getting refills

## 2015-05-24 ENCOUNTER — Ambulatory Visit (INDEPENDENT_AMBULATORY_CARE_PROVIDER_SITE_OTHER): Payer: BC Managed Care – PPO | Admitting: Family Medicine

## 2015-05-24 VITALS — BP 116/80 | HR 85 | Temp 98.7°F | Resp 16 | Ht 65.25 in | Wt 220.4 lb

## 2015-05-24 DIAGNOSIS — Z6836 Body mass index (BMI) 36.0-36.9, adult: Secondary | ICD-10-CM | POA: Diagnosis not present

## 2015-05-24 DIAGNOSIS — E669 Obesity, unspecified: Secondary | ICD-10-CM

## 2015-05-24 DIAGNOSIS — F411 Generalized anxiety disorder: Secondary | ICD-10-CM | POA: Diagnosis not present

## 2015-05-24 DIAGNOSIS — E282 Polycystic ovarian syndrome: Secondary | ICD-10-CM | POA: Insufficient documentation

## 2015-05-24 MED ORDER — FLUOXETINE HCL 20 MG PO TABS: ORAL_TABLET | ORAL | Status: DC

## 2015-05-24 NOTE — Progress Notes (Signed)
Subjective:    Patient ID: Sherry Mercado, female    DOB: 22-Dec-1990, 25 y.o.   MRN: [id number]  05/24/2015  Medication Refill and Depression   HPI This 25 y.o. female presents for one year follow-up for the following:  PCOS:  Taking Metformin  bid; taking Sprintec daily; working well; periods normal.  Taking Metformin  bid; suffers with nausea with  bid.  Has not tried three Metformin daily.  Taking Metformin with food; usually takes after eats.    Anxiety and depression: Patient reports good compliance with medication, good tolerance to medication, and good symptom control.  Taking Prozac  daily.  Pretty depressed for the past few months; anxiety has also been worse; poor motivation; working on summer job and cannot get motivated to apply.  No SI.  Only working two days per week; graduated in December 2016; very proud; feels that completing school is trigger.  Living at home with parents.  Going well living with parents.  Sleeping more than should; then schedule is inconsistent. No significant exercise; does walk with dogs; two days; walks with dogs once per day 15 minutes.  Brother would go with patient; brother is 37.    No previous pap smear.  Masters program starts late August.  In June and July may be working at [employer] in [place].    Review of Systems  Constitutional: Negative for fever, chills, diaphoresis and fatigue.  Eyes: Negative for visual disturbance.  Respiratory: Negative for cough and shortness of breath.   Cardiovascular: Negative for chest pain, palpitations and leg swelling.  Gastrointestinal: Positive for nausea. Negative for vomiting, abdominal pain, diarrhea and constipation.  Endocrine: Negative for cold intolerance, heat intolerance, polydipsia, polyphagia and polyuria.  Neurological: Negative for dizziness, tremors, seizures, syncope, facial asymmetry, speech difficulty, weakness, light-headedness, numbness and headaches.    Psychiatric/Behavioral: Positive for dysphoric mood. Negative for suicidal ideas, sleep disturbance and self-injury. The patient is nervous/anxious.     Past Medical History  Diagnosis Date  . Depression   . Anxiety   . Allergy     Zyrtec PRN   Past Surgical History  Procedure Laterality Date  . Eye surgery      tear duct procedure   No Known Allergies  Social History   Social History  . Marital Status: Single    Spouse Name: N/A  . Number of Children: N/A  . Years of Education: N/A   Occupational History  . Not on file.   Social History Main Topics  . Smoking status: Never Smoker   . Smokeless tobacco: Not on file  . Alcohol Use: 0.0 oz/week    0 Shots of liquor per week     Comment: social  . Drug Use: No  . Sexual Activity:    Partners: Male    Birth Control/ Protection: Condom   Other Topics Concern  . Not on file   Social History Narrative   Marital status: single; not dating seriously.     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      Tobacco: none      Alcohol: socially      Drugs: none      Sexual activity: dates females and males.   Family History  Problem Relation Age of Onset  . Hearing loss Mother   . Bipolar  disorder Mother   . Depression Mother   . Anxiety disorder Mother   . Bipolar disorder Brother   . Anxiety disorder Brother   . Cancer Maternal Grandmother     breast cancer  . Bipolar disorder Maternal Grandmother   . Depression Maternal Grandmother   . Hearing loss Maternal Grandfather   . Diabetes Father   . Anxiety disorder Father        Objective:    BP 116/80 mmHg  Pulse 85  Temp(Src) 98.7 F (37.1 C) (Oral)  Resp 16  Ht 5' 5.25" (1.657 m)  Wt 220 lb 6.4 oz (99.973 kg)  BMI 36.41 kg/m2  SpO2 95% Physical Exam  Constitutional: She is oriented to person, place, and time. She  appears well-developed and well-nourished. No distress.  HENT:  Head: Normocephalic and atraumatic.  Right Ear: External ear normal.  Left Ear: External ear normal.  Nose: Nose normal.  Mouth/Throat: Oropharynx is clear and moist.  Eyes: Conjunctivae and EOM are normal. Pupils are equal, round, and reactive to light.  Neck: Normal range of motion. Neck supple. Carotid bruit is not present. No thyromegaly present.  Cardiovascular: Normal rate, regular rhythm, normal heart sounds and intact distal pulses.  Exam reveals no gallop and no friction rub.   No murmur heard. Pulmonary/Chest: Effort normal and breath sounds normal. She has no wheezes. She has no rales.  Abdominal: Soft. Bowel sounds are normal. She exhibits no distension and no mass. There is no tenderness. There is no rebound and no guarding.  Lymphadenopathy:    She has no cervical adenopathy.  Neurological: She is alert and oriented to person, place, and time. No cranial nerve deficit.  Skin: Skin is warm and dry. No rash noted. She is not diaphoretic. No erythema. No pallor.  Psychiatric: She has a normal mood and affect. Her behavior is normal.   Results for orders placed or performed in visit on 08/29/14  Lipid Profile  Result Value Ref Range   Cholesterol 129 0 - 200 mg/dL   Triglycerides 161.0 0.0 - 149.0 mg/dL   HDL 96.04 >54.09 mg/dL   VLDL 81.1 0.0 - 91.4 mg/dL   LDL Cholesterol 62 0 - 99 mg/dL   Total CHOL/HDL Ratio 3    NonHDL 84.00   HgB A1c  Result Value Ref Range   Hgb A1c MFr Bld 5.1 4.6 - 6.5 %  Testosterone, Free, Total, SHBG  Result Value Ref Range   Testosterone 114 (H) 10 - 70 ng/dL   Sex Hormone Binding 15 (L) 17 - 124 nmol/L   Testosterone, Free 30.8 (H) 0.6 - 6.8 pg/mL   Testosterone-% Free 2.7 (H) 0.4 - 2.4 %  TSH  Result Value Ref Range   TSH 2.09 0.35 - 4.50 uIU/mL  T4, free  Result Value Ref Range   Free T4 0.91 0.60 - 1.60 ng/dL  T3, free  Result Value Ref Range   T3, Free 3.8 2.3 -  4.2 pg/mL       Assessment & Plan:   1. Anxiety state   2. PCOS (polycystic ovarian syndrome)   3. Obesity   4. BMI 36.0-36.9,adult    -uncontrolled; increase prozac to  daily. -congratulations on weight loss.     No orders of the defined types were placed in this encounter.   Meds ordered this encounter  Medications  . FLUoxetine (PROZAC) 20 MG tablet    Sig: TAKE 2 TABLET BY MOUTH EVERY DAY    Dispense:  60  tablet    Refill:  5    Return in about 4 months (around 09/23/2015).    Yoshi Mancillas Paulita FujitaMartin Geofrey Silliman, M.D. [facility]  [place] [address] [place], [place]  [id number] [phone] phone [phone] fax

## 2015-05-24 NOTE — Patient Instructions (Signed)
1.  Call Dr. Lavella HammockGherge's office for follow-up appointment. 2.  Start walking with dogs, your brother for 30 minutes each day. 3.  Return in early August for a complete physical examination. 4. Increase your Prozac/Fluoxetine 20mg  to two tablets daily. 5.  Try increasing Metformin to 500mg  one tablet three times times (breakfast, lunch, dinner)

## 2015-07-07 ENCOUNTER — Ambulatory Visit (INDEPENDENT_AMBULATORY_CARE_PROVIDER_SITE_OTHER): Payer: BC Managed Care – PPO | Admitting: Physician Assistant

## 2015-07-07 VITALS — BP 126/86 | HR 91 | Temp 99.0°F | Resp 17 | Ht 65.0 in | Wt 215.0 lb

## 2015-07-07 DIAGNOSIS — H6501 Acute serous otitis media, right ear: Secondary | ICD-10-CM

## 2015-07-07 MED ORDER — AMOXICILLIN 500 MG PO CAPS: 500.0000 mg | ORAL_CAPSULE | Freq: Two times a day (BID) | ORAL | Status: DC

## 2015-07-07 NOTE — Patient Instructions (Addendum)
Please take the amoxicillin twice daily for 5 days.  You have congestion behind your ears. Taking mucinex-d twice daily for the next few days will help with the congestion.   Otitis Media, Adult Otitis media is redness, soreness, and inflammation of the middle ear. Otitis media may be caused by allergies or, most commonly, by infection. Often it occurs as a complication of the common cold. SIGNS AND SYMPTOMS Symptoms of otitis media may include:  Earache.  Fever.  Ringing in your ear.  Headache.  Leakage of fluid from the ear. DIAGNOSIS To diagnose otitis media, your health care provider will examine your ear with an otoscope. This is an instrument that allows your health care provider to see into your ear in order to examine your eardrum. Your health care provider also will ask you questions about your symptoms. TREATMENT  Typically, otitis media resolves on its own within 3-5 days. Your health care provider may prescribe medicine to ease your symptoms of pain. If otitis media does not resolve within 5 days or is recurrent, your health care provider may prescribe antibiotic medicines if he or she suspects that a bacterial infection is the cause. HOME CARE INSTRUCTIONS   If you were prescribed an antibiotic medicine, finish it all even if you start to feel better.  Take medicines only as directed by your health care provider.  Keep all follow-up visits as directed by your health care provider. SEEK MEDICAL CARE IF:  You have otitis media only in one ear, or bleeding from your nose, or both.  You notice a lump on your neck.  You are not getting better in 3-5 days.  You feel worse instead of better. SEEK IMMEDIATE MEDICAL CARE IF:   You have pain that is not controlled with medicine.  You have swelling, redness, or pain around your ear or stiffness in your neck.  You notice that part of your face is paralyzed.  You notice that the bone behind your ear (mastoid) is tender  when you touch it. MAKE SURE YOU:   Understand these instructions.  Will watch your condition.  Will get help right away if you are not doing well or get worse.   This information is not intended to replace advice given to you by your health care provider. Make sure you discuss any questions you have with your health care provider.   Document Released: 11/10/2003 Document Revised: 02/25/2014 Document Reviewed: 09/01/2012 Elsevier Interactive Patient Education [employer]2016 Elsevier Inc.

## 2015-07-07 NOTE — Progress Notes (Signed)
   Subjective:    Patient ID: Sherry Mercado, female    DOB: Jan 18, 1991, 25 y.o.   MRN: [id number]  Chief Complaint  Patient presents with  . Nasal Congestion  . Ear Pain   Medications, allergies, past medical history, surgical history, family history, social history and problem list reviewed and updated.  HPI  25 yof presents with above complaints.   Symptoms started 3 days ago with sore throat, head/nasal congestion and left ear pain. Congestion persistent. Ear pain worsening past several days. She has had multiple ear infection in the past with both TMs rupturing in the past.   Denies recent improvement of pain, tinnitus, or drainage from ear.   Denies fevers, chills. Has not taken anything. No known sick contacts. 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   Review of Systems No abd pain, st, n/v.     Objective:   Physical Exam  Constitutional: She appears well-developed and well-nourished.  Non-toxic appearance. She does not have a sickly appearance. She does not appear ill. No distress.  BP 126/86 mmHg  Pulse 91  Temp(Src) 99 F (37.2 C) (Oral)  Resp 17  Ht 5\' 5"  (1.651 m)  Wt 215 lb (97.523 kg)  BMI 35.78 kg/m2  SpO2 98%   HENT:  Right Ear: No drainage or tenderness. No mastoid tenderness. Tympanic membrane is erythematous and bulging. Tympanic membrane is not perforated. A middle ear effusion is present.  Left Ear: A middle ear effusion is present.  Nose: Mucosal edema and rhinorrhea present. Right sinus exhibits no maxillary sinus tenderness and no frontal sinus tenderness. Left sinus exhibits no maxillary sinus tenderness and no frontal sinus tenderness.  Mouth/Throat: Uvula is midline, oropharynx is clear and moist and mucous membranes are normal.  Neck: No Brudzinski's sign noted.      Assessment & Plan:   Right acute serous otitis media, recurrence not specified - Plan: amoxicillin (AMOXIL) 500 MG capsule --right OM -  amoxicillin --mucinex-d for congestion likely leading to OM  --rtc if no relief or worsening of symptoms   Donnajean Lopesodd M. Whittany Parish, PA-C Physician Assistant-Certified [facility]  07/08/2015 12:20 PM

## 2015-07-15 ENCOUNTER — Ambulatory Visit (INDEPENDENT_AMBULATORY_CARE_PROVIDER_SITE_OTHER): Payer: BC Managed Care – PPO | Admitting: Physician Assistant

## 2015-07-15 VITALS — BP 118/82 | HR 98 | Temp 98.1°F | Resp 18 | Ht 65.0 in | Wt 213.2 lb

## 2015-07-15 DIAGNOSIS — R05 Cough: Secondary | ICD-10-CM | POA: Diagnosis not present

## 2015-07-15 DIAGNOSIS — R059 Cough, unspecified: Secondary | ICD-10-CM

## 2015-07-15 MED ORDER — PREDNISONE 20 MG PO TABS: ORAL_TABLET | ORAL | Status: DC

## 2015-07-15 MED ORDER — HYDROCOD POLST-CPM POLST ER 10-8 MG/5ML PO SUER: 5.0000 mL | Freq: Two times a day (BID) | ORAL | Status: DC | PRN

## 2015-07-15 NOTE — Patient Instructions (Addendum)
Get plenty of rest and drink at least 64 ounces of water daily.     IF you received an x-ray today, you will receive an invoice from [facility]. Please contact [facility] at [phone] with questions or concerns regarding your invoice.   IF you received labwork today, you will receive an invoice from [facility]. Please contact [facility] at [phone] with questions or concerns regarding your invoice.   Our billing staff will not be able to assist you with questions regarding bills from these companies.  You will be contacted with the lab results as soon as they are available. The fastest way to get your results is to activate your My Chart account. Instructions are located on the last page of this paperwork. If you have not heard from us regarding the results in 2 weeks, please contact this office.     

## 2015-07-15 NOTE — Progress Notes (Signed)
Patient ID: Sherry Mercado, female    DOB: 03-15-1990, 25 y.o.   MRN: [id number]  PCP: Nilda SimmerSMITH,KRISTI, MD  Subjective:   Chief Complaint  Patient presents with  . Sinusitis    x1.5 weeks    HPI Presents for evaluation of sinusitis.  Seen here 07/07/15 with these symptoms. She was noted to have AOM of the RIGHT ear. Prescribed amoxicillin and Mucinex-D. and felt like she was getting better, but then upon completing it, got worse again, worse than before.  No ear pain. Some, but not as much, nasal/sinus congestion. Sore throat., waxing and waning-couldn't sleep last night. Coughing a lot, productive of green-yellow sputum. Nasal drainage is clear. No fever, chills. No GI/GU symptoms.  [place] 1 week [place]     Review of Systems As above.    Patient Active Problem List   Diagnosis Date Noted  . PCOS (polycystic ovarian syndrome) 05/24/2015  . SYNCOPE 10/19/2007  . Anxiety state 09/18/2006  . PMS 09/18/2006  . ADHD 06/12/2006     Prior to Admission medications   Medication Sig Start Date End Date Taking? Authorizing Provider  FLUoxetine (PROZAC) 20 MG tablet TAKE 2 TABLET BY MOUTH EVERY DAY 05/24/15  Yes Ethelda ChickKristi M Smith, MD  metFORMIN (GLUCOPHAGE) 500 MG tablet Take 2 tablets (1,000 mg total) by mouth 2 (two) times daily with a meal. 08/29/14  Yes Carlus Pavlovristina Gherghe, MD  norgestimate-ethinyl estradiol (ORTHO-CYCLEN, 28,) 0.25-35 MG-MCG tablet Take 1 tablet by mouth daily. 08/29/14  Yes Carlus Pavlovristina Gherghe, MD     No Known Allergies     Objective:  Physical Exam  Constitutional: She is oriented to person, place, and time. She appears well-developed and well-nourished. No distress.  BP 118/82 mmHg  Pulse 98  Temp(Src) 98.1 F (36.7 C) (Oral)  Resp 18  Ht 5\' 5"  (1.651 m)  Wt 213 lb 3.2 oz (96.707 kg)  BMI 35.48 kg/m2  SpO2 97%   HENT:  Head: Normocephalic and atraumatic.  Right Ear: Hearing, tympanic membrane, external ear and ear  canal normal.  Left Ear: Hearing, tympanic membrane, external ear and ear canal normal.  Nose: Mucosal edema present. No rhinorrhea.  No foreign bodies. Right sinus exhibits no maxillary sinus tenderness and no frontal sinus tenderness. Left sinus exhibits no maxillary sinus tenderness and no frontal sinus tenderness.  Mouth/Throat: Uvula is midline, oropharynx is clear and moist and mucous membranes are normal. No uvula swelling. No oropharyngeal exudate.  Eyes: Conjunctivae and EOM are normal. Pupils are equal, round, and reactive to light. Right eye exhibits no discharge. Left eye exhibits no discharge. No scleral icterus.  Neck: Trachea normal, normal range of motion and full passive range of motion without pain. Neck supple. No thyroid mass and no thyromegaly present.  Cardiovascular: Normal rate, regular rhythm and normal heart sounds.   Pulmonary/Chest: Effort normal and breath sounds normal.  Lymphadenopathy:       Head (right side): No submandibular, no tonsillar, no preauricular, no posterior auricular and no occipital adenopathy present.       Head (left side): No submandibular, no tonsillar, no preauricular and no occipital adenopathy present.    She has no cervical adenopathy.       Right: No supraclavicular adenopathy present.       Left: No supraclavicular adenopathy present.  Neurological: She is alert and oriented to person, place, and time. She has normal strength. No cranial nerve deficit or sensory deficit.  Skin: Skin is warm,  dry and intact. No rash noted.  Psychiatric: She has a normal mood and affect. Her speech is normal and behavior is normal.           Assessment & Plan:   1. Cough I think that she has cleared the infection, just not the congestion and drainage. Prednisone taper. Tussionex. Expect significant improvement in 72 hours. If not, would start another antibiotic. - chlorpheniramine-HYDROcodone (TUSSIONEX PENNKINETIC ER) 10-8 MG/5ML SUER; Take 5 mLs by  mouth every 12 (twelve) hours as needed for cough.  Dispense: 100 mL; Refill: 0 - predniSONE (DELTASONE) 20 MG tablet; Take 3 PO QAM x3days, 2 PO QAM x3days, 1 PO QAM x3days  Dispense: 18 tablet; Refill: 0   Fernande Bras, PA-C Physician Assistant-Certified [facility]

## 2015-07-27 ENCOUNTER — Other Ambulatory Visit: Payer: Self-pay | Admitting: Internal Medicine

## 2015-08-15 ENCOUNTER — Other Ambulatory Visit: Payer: Self-pay | Admitting: Internal Medicine

## 2015-08-29 ENCOUNTER — Other Ambulatory Visit: Payer: Self-pay | Admitting: Internal Medicine

## 2015-09-11 ENCOUNTER — Other Ambulatory Visit: Payer: Self-pay

## 2015-09-11 ENCOUNTER — Telehealth: Payer: Self-pay

## 2015-09-11 MED ORDER — NORGESTIMATE-ETH ESTRADIOL 0.25-35 MG-MCG PO TABS: 1.0000 | ORAL_TABLET | Freq: Every day | ORAL | 0 refills | Status: DC

## 2015-09-11 NOTE — Telephone Encounter (Signed)
Rx sent electronically.  

## 2015-09-11 NOTE — Telephone Encounter (Signed)
Pts father was calling about her meds that she needed refilled. They were denied because she needed an appointment. Pt does not get back until August 6 and she is scheduled with [facility]le 10/10/15 @ 4:00. Pt father was hoping that since the appt is scheduled that the meds can be refilled? He believes its birth control and metformin are needed.   Please advise  [phone]

## 2015-09-12 MED ORDER — METFORMIN HCL 500 MG PO TABS: ORAL_TABLET | ORAL | 0 refills | Status: DC

## 2015-09-12 MED ORDER — NORGESTIMATE-ETH ESTRADIOL 0.25-35 MG-MCG PO TABS: 1.0000 | ORAL_TABLET | Freq: Every day | ORAL | 0 refills | Status: DC

## 2015-09-12 NOTE — Telephone Encounter (Signed)
Can we refill? 

## 2015-09-12 NOTE — Telephone Encounter (Signed)
Called father and advised we sent in RFs of both meds.

## 2015-09-12 NOTE — Telephone Encounter (Signed)
Meds ordered this encounter  Medications  . metFORMIN (GLUCOPHAGE) 500 MG tablet    Sig: TAKE 2 TABLETS (1,000 MG TOTAL) BY MOUTH 2 (TWO) TIMES DAILY WITH A MEAL.    Dispense:  120 tablet    Refill:  0    Patient needs appointment for further refills.    Order Specific Question:   Supervising Provider    Answer:   Clelia Croft, EVA N [[id number]]  . norgestimate-ethinyl estradiol (SPRINTEC 28) 0.25-35 MG-MCG tablet    Sig: Take 1 tablet by mouth daily.    Dispense:  28 tablet    Refill:  0    Patient needs appointment for further refills.    Order Specific Question:   Supervising Provider    Answer:   Sherren Mocha [phone]

## 2015-09-12 NOTE — Addendum Note (Signed)
Addended by: Fernande Bras on: 09/12/2015 01:47 PM   Modules accepted: Orders

## 2015-10-10 ENCOUNTER — Ambulatory Visit: Payer: BC Managed Care – PPO | Admitting: Physician Assistant

## 2015-10-28 ENCOUNTER — Ambulatory Visit (INDEPENDENT_AMBULATORY_CARE_PROVIDER_SITE_OTHER): Payer: BC Managed Care – PPO | Admitting: Physician Assistant

## 2015-10-28 ENCOUNTER — Encounter: Payer: Self-pay | Admitting: Physician Assistant

## 2015-10-28 VITALS — BP 118/60 | HR 88 | Temp 98.3°F | Resp 18 | Ht 65.0 in | Wt 205.2 lb

## 2015-10-28 DIAGNOSIS — F418 Other specified anxiety disorders: Secondary | ICD-10-CM

## 2015-10-28 DIAGNOSIS — F419 Anxiety disorder, unspecified: Principal | ICD-10-CM

## 2015-10-28 DIAGNOSIS — F329 Major depressive disorder, single episode, unspecified: Secondary | ICD-10-CM

## 2015-10-28 MED ORDER — FLUOXETINE HCL 60 MG PO TABS: 60.0000 mg | ORAL_TABLET | Freq: Every day | ORAL | 2 refills | Status: DC

## 2015-10-28 NOTE — Progress Notes (Signed)
10/28/2015 2:49 PM   DOB: 1990-11-23 / MRN: [id number]  SUBJECTIVE:  Sherry Mercado is a 25 y.o. female presenting to increase her fluoxetine.  She has been taking for quite some time now and reports it has greatly improved her energy and motivation.  States it has also improved her outlook and she does not dwell on negative thoughts as much now than before starting the medication.  However, she continues to have some negative emotions and anhedonia and would like to try taking a higher dose of medication to see if this helps her the way it has helped in the past.   She does consume some wine about once every 4 weeks.  She is a non smoker.    She has No Known Allergies.   She  has a past medical history of Allergy; Anxiety; and Depression.    She  reports that she has never smoked. She does not have any smokeless tobacco history on file. She reports that she drinks alcohol. She reports that she does not use drugs. She  reports that she currently engages in sexual activity and has had female partners. She reports using the following method of birth control/protection: Condom. The patient  has a past surgical history that includes Eye surgery.  Her family history includes Anxiety disorder in her brother, father, and mother; Bipolar disorder in her brother, maternal grandmother, and mother; Cancer in her maternal grandmother; Depression in her maternal grandmother and mother; Diabetes in her father; Hearing loss in her maternal grandfather and mother.  Review of Systems  Constitutional: Negative for chills and fever.  Skin: Negative for rash.  Neurological: Negative for tremors and headaches.  Psychiatric/Behavioral: Positive for depression. Negative for hallucinations, memory loss, substance abuse and suicidal ideas. The patient is nervous/anxious. The patient does not have insomnia.     The problem list and medications were reviewed and updated by myself where necessary and exist elsewhere in  the encounter.   OBJECTIVE:  BP 118/60   Pulse 88   Temp 98.3 F (36.8 C) (Oral)   Resp 18   Ht 5\' 5"  (1.651 m)   Wt 205 lb 3.2 oz (93.1 kg)   LMP  (LMP Unknown)   SpO2 98%   BMI 34.15 kg/m   Physical Exam  Constitutional: She is oriented to person, place, and time. She appears well-developed and well-nourished. No distress.  Cardiovascular: Normal rate and regular rhythm.   Pulmonary/Chest: Effort normal and breath sounds normal.  Musculoskeletal: Normal range of motion.  Neurological: She is alert and oriented to person, place, and time.  Skin: Skin is warm and dry. She is not diaphoretic.  Psychiatric: Her mood appears anxious. Her affect is not angry, not labile and not inappropriate. Her speech is not rapid and/or pressured, not delayed, not tangential and not slurred. She is not agitated, not aggressive, not hyperactive, not slowed, not withdrawn, not actively hallucinating and not combative. Thought content is not paranoid and not delusional. Cognition and memory are not impaired. She does not express impulsivity or inappropriate judgment. She does not exhibit a depressed mood. She expresses no homicidal and no suicidal ideation. She expresses no suicidal plans and no homicidal plans. She is communicative. She exhibits normal recent memory and normal remote memory. She is attentive.    No results found for this or any previous visit (from the past 72 hour(s)).  No results found.  ASSESSMENT AND PLAN  Sherry Mercado was seen today for medication dose change.  Diagnoses  and all orders for this visit:  Anxiety and depression: Patient with good improvement in anhedonia and dysthymic mood with prozac in the past.  Will increase her dose from 40 mg to 60 mg today.   -     FLUoxetine HCl 60 MG TABS; Take 60 mg by mouth daily.    The patient is advised to call or return to clinic if she does not see an improvement in symptoms, or to seek the care of the closest emergency department if  she worsens with the above plan.   Deliah BostonMichael Mellisa Arshad, MHS, PA-C [facility] 10/28/2015 2:49 PM

## 2015-10-28 NOTE — Patient Instructions (Signed)
     IF you received an x-ray today, you will receive an invoice from [facility]. Please contact [facility] at [phone] with questions or concerns regarding your invoice.   IF you received labwork today, you will receive an invoice from [facility]. Please contact [facility] at [phone] with questions or concerns regarding your invoice.   Our billing staff will not be able to assist you with questions regarding bills from these companies.  You will be contacted with the lab results as soon as they are available. The fastest way to get your results is to activate your My Chart account. Instructions are located on the last page of this paperwork. If you have not heard from us regarding the results in 2 weeks, please contact this office.      

## 2015-11-02 ENCOUNTER — Other Ambulatory Visit: Payer: Self-pay | Admitting: Physician Assistant

## 2015-11-12 ENCOUNTER — Other Ambulatory Visit: Payer: Self-pay | Admitting: Physician Assistant

## 2015-11-14 ENCOUNTER — Other Ambulatory Visit: Payer: Self-pay

## 2015-11-14 MED ORDER — NORGESTIMATE-ETH ESTRADIOL 0.25-35 MG-MCG PO TABS: 1.0000 | ORAL_TABLET | Freq: Every day | ORAL | 0 refills | Status: DC

## 2015-12-02 ENCOUNTER — Other Ambulatory Visit: Payer: Self-pay | Admitting: Physician Assistant

## 2015-12-18 ENCOUNTER — Other Ambulatory Visit: Payer: Self-pay | Admitting: Family Medicine

## 2015-12-20 ENCOUNTER — Ambulatory Visit (INDEPENDENT_AMBULATORY_CARE_PROVIDER_SITE_OTHER): Payer: BC Managed Care – PPO | Admitting: Physician Assistant

## 2015-12-20 VITALS — BP 120/82 | HR 93 | Temp 98.3°F | Resp 17 | Ht 65.0 in | Wt 204.0 lb

## 2015-12-20 DIAGNOSIS — Z309 Encounter for contraceptive management, unspecified: Secondary | ICD-10-CM

## 2015-12-20 LAB — POCT URINE PREGNANCY: Preg Test, Ur: NEGATIVE

## 2015-12-20 MED ORDER — NORGESTIMATE-ETH ESTRADIOL 0.25-35 MG-MCG PO TABS: 1.0000 | ORAL_TABLET | Freq: Every day | ORAL | 3 refills | Status: DC

## 2015-12-20 NOTE — Progress Notes (Signed)
   Sherry Mercado  MRN: [id number] DOB: 02/17/1991  Subjective:  Sherry Mercado is a 25 y.o. female seen in office today for a chief complaint of med refill for Sprintec. She has been taking Sprintec for over one year and notes that it works very well and has no side effects. Pt is not currently sexually active. She takes it to regulate her cycles. She states that her cycles have been regular ever since she started the Sprintec. Prior to this she was experiencing amenorrhea. She denies menorrhagia and dysmenorrhea. Pt does not smoke, has no personal history or family history of clotting disorders. Of note, pt ran out of her Sprintec three days ago. Declines pap smear today.   Review of Systems  Gastrointestinal: Negative for abdominal pain, diarrhea, nausea and vomiting.  Genitourinary: Negative for menstrual problem.  Neurological: Negative for headaches.    Patient Active Problem List   Diagnosis Date Noted  . PCOS (polycystic ovarian syndrome) 05/24/2015  . SYNCOPE 10/19/2007  . Anxiety state 09/18/2006  . PMS 09/18/2006  . ADHD 06/12/2006    Current Outpatient Prescriptions on File Prior to Visit  Medication Sig Dispense Refill  . FLUoxetine HCl 60 MG TABS Take 60 mg by mouth daily. 90 tablet 2  . metFORMIN (GLUCOPHAGE) 500 MG tablet TAKE 2 TABLETS (1,000 MG TOTAL) BY MOUTH 2 (TWO) TIMES DAILY WITH A MEAL. 60 tablet 0   No current facility-administered medications on file prior to visit.     No Known Allergies  Objective:  BP 120/82 (BP Location: Left Arm, Patient Position: Sitting, Cuff Size: Normal)   Pulse 93   Temp 98.3 F (36.8 C) (Oral)   Resp 17   Ht 5\' 5"  (1.651 m)   Wt 204 lb (92.5 kg)   LMP 12/10/2015   SpO2 99%   BMI 33.95 kg/m   Physical Exam  Constitutional: She is oriented to person, place, and time and well-developed, well-nourished, and in no distress.  HENT:  Head: Normocephalic and atraumatic.  Eyes: Conjunctivae are normal.  Neck: Normal  range of motion.  Cardiovascular: Normal rate, regular rhythm and normal heart sounds.   Pulmonary/Chest: Effort normal.  Musculoskeletal:       Right lower leg: She exhibits no swelling.       Left lower leg: She exhibits no swelling.  Neurological: She is alert and oriented to person, place, and time. Gait normal.  Skin: Skin is warm and dry.  Psychiatric: Affect normal.  Vitals reviewed.   Results for orders placed or performed in visit on 12/20/15 (from the past 24 hour(s))  POCT urine pregnancy     Status: None   Collection Time: 12/20/15  4:37 PM  Result Value Ref Range   Preg Test, Ur Negative Negative    Assessment and Plan :  1. Encounter for contraceptive management, unspecified type -Pt encouraged to follow up for an annual physical exam so she can have pap smear at this time. Pt declines pap today as she states she needs to be mentally prepared before having this done because it will be her first ever pap smear.  - POCT urine pregnancy - norgestimate-ethinyl estradiol (SPRINTEC 28) 0.25-35 MG-MCG tablet; Take 1 tablet by mouth daily.  Dispense: 84 tablet; Refill: 3  Benjiman CoreBrittany Wiseman PA-C  [facility] 12/20/2015 4:40 PM

## 2015-12-20 NOTE — Patient Instructions (Addendum)
  Follow up for annual physical exam. Thank you for letting me participate in your health and well being.   IF you received an x-ray today, you will receive an invoice from [facility]. Please contact [facility] at [phone] with questions or concerns regarding your invoice.   IF you received labwork today, you will receive an invoice from [facility]. Please contact [facility] at [phone] with questions or concerns regarding your invoice.   Our billing staff will not be able to assist you with questions regarding bills from these companies.  You will be contacted with the lab results as soon as they are available. The fastest way to get your results is to activate your My Chart account. Instructions are located on the last page of this paperwork. If you have not heard from us regarding the results in 2 weeks, please contact this office.

## 2015-12-23 ENCOUNTER — Other Ambulatory Visit: Payer: Self-pay | Admitting: Physician Assistant

## 2015-12-27 NOTE — Telephone Encounter (Signed)
08/2014 last labs 12/20/15 last ov

## 2015-12-28 ENCOUNTER — Ambulatory Visit (INDEPENDENT_AMBULATORY_CARE_PROVIDER_SITE_OTHER): Payer: BC Managed Care – PPO | Admitting: Emergency Medicine

## 2015-12-28 VITALS — BP 118/82 | HR 101 | Temp 98.7°F | Resp 18 | Ht 65.0 in | Wt 196.2 lb

## 2015-12-28 DIAGNOSIS — R05 Cough: Secondary | ICD-10-CM | POA: Diagnosis not present

## 2015-12-28 DIAGNOSIS — J029 Acute pharyngitis, unspecified: Secondary | ICD-10-CM | POA: Diagnosis not present

## 2015-12-28 DIAGNOSIS — R059 Cough, unspecified: Secondary | ICD-10-CM

## 2015-12-28 LAB — POCT RAPID STREP A (OFFICE): RAPID STREP A SCREEN: NEGATIVE

## 2015-12-28 MED ORDER — BENZONATATE 100 MG PO CAPS: 100.0000 mg | ORAL_CAPSULE | Freq: Three times a day (TID) | ORAL | 0 refills | Status: DC | PRN

## 2015-12-28 MED ORDER — AZITHROMYCIN 250 MG PO TABS: ORAL_TABLET | ORAL | 0 refills | Status: DC

## 2015-12-28 NOTE — Patient Instructions (Addendum)
     IF you received an x-ray today, you will receive an invoice from [facility]. Please contact [facility] at [phone] with questions or concerns regarding your invoice.   IF you received labwork today, you will receive an invoice from [facility]. Please contact [facility] at [phone] with questions or concerns regarding your invoice.   Our billing staff will not be able to assist you with questions regarding bills from these companies.  You will be contacted with the lab results as soon as they are available. The fastest way to get your results is to activate your My Chart account. Instructions are located on the last page of this paperwork. If you have not heard from us regarding the results in 2 weeks, please contact this office.     cough Cough, Adult Coughing is a reflex that clears your throat and your airways. Coughing helps to heal and protect your lungs. It is normal to cough occasionally, but a cough that happens with other symptoms or lasts a long time may be a sign of a condition that needs treatment. A cough may last only 2-3 weeks (acute), or it may last longer than 8 weeks (chronic). CAUSES Coughing is commonly caused by:  Breathing in substances that irritate your lungs.  A viral or bacterial respiratory infection.  Allergies.  Asthma.  Postnasal drip.  Smoking.  Acid backing up from the stomach into the esophagus (gastroesophageal reflux).  Certain medicines.  Chronic lung problems, including COPD (or rarely, lung cancer).  Other medical conditions such as heart failure. HOME CARE INSTRUCTIONS  Pay attention to any changes in your symptoms. Take these actions to help with your discomfort:  Take medicines only as told by your health care provider.  If you were prescribed an antibiotic medicine, take it as told by your health care provider. Do not stop taking the antibiotic even if you start to feel  better.  Talk with your health care provider before you take a cough suppressant medicine.  Drink enough fluid to keep your urine clear or pale yellow.  If the air is dry, use a cold steam vaporizer or humidifier in your bedroom or your home to help loosen secretions.  Avoid anything that causes you to cough at work or at home.  If your cough is worse at night, try sleeping in a semi-upright position.  Avoid cigarette smoke. If you smoke, quit smoking. If you need help quitting, ask your health care provider.  Avoid caffeine.  Avoid alcohol.  Rest as needed. SEEK MEDICAL CARE IF:   You have new symptoms.  You cough up pus.  Your cough does not get better after 2-3 weeks, or your cough gets worse.  You cannot control your cough with suppressant medicines and you are losing sleep.  You develop pain that is getting worse or pain that is not controlled with pain medicines.  You have a fever.  You have unexplained weight loss.  You have night sweats. SEEK IMMEDIATE MEDICAL CARE IF:  You cough up blood.  You have difficulty breathing.  Your heartbeat is very fast.   This information is not intended to replace advice given to you by your health care provider. Make sure you discuss any questions you have with your health care provider.   Document Released: 08/03/2010 Document Revised: 10/26/2014 Document Reviewed: 04/13/2014 Elsevier Interactive Patient Education [employer]2016 Elsevier Inc.

## 2015-12-28 NOTE — Telephone Encounter (Signed)
Call -- I approved one month refill; patient needs follow-up OV/CPE with me for further refills; please have her schedule a CPE with me.

## 2015-12-28 NOTE — Progress Notes (Signed)
Patient ID: Sherry Mercado, female   DOB: Apr 16, 1990, 25 y.o.   MRN: [id number]    By signing my name below, I, [name], attest that this documentation has been prepared under the direction and in the presence of Collene GobbleSteven A Daub, MD Electronically Signed: Charline BillsEssence Howell, ED Scribe 12/28/2015 at 11:00 AM.  Chief Complaint:  Chief Complaint  Patient presents with  . URI    since last Sunday. Cough, runny nose, and fatigue.    HPI: Sherry HeadsKayla Mercado is a 25 y.o. female who [facility] complaining of productive cough with yellow sputum for the past 4 days. Pt reports associated symptoms of chest congestion, sore throat, rhinorrhea, fatigue and postnasal drip onset yesterday. She has tried NyQuil, DayQuil, EchoStareti Pot and gargling warm salt water without significant relief. Pt denies fever, chills and generalized body aches. She reports h/o bronchitis in the winter which has required inhaler use and z-pak. No h/o asthma. No known drug allergies. Pt is currently on BC pills for PCOS.  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   Past Medical History:  Diagnosis Date  . Allergy    Zyrtec PRN  . Anxiety   . Depression    Past Surgical History:  Procedure Laterality Date  . EYE SURGERY     tear duct procedure   Social History   Social History  . Marital status: Single    Spouse name: n/a  . Number of children: 0  . Years of education: college   Occupational History  . singer    Social History Main Topics  . Smoking status: Never Smoker  . Smokeless tobacco: None  . Alcohol use 0.0 oz/week     Comment: social  . Drug use: No  . Sexual activity: Yes    Partners: Male    Birth control/ protection: Condom   Other Topics Concern  . None   Social History Narrative   Marital status: single; not dating seriously.     Children: none      Lives: with parents, brothers      Employment:  Consulting civil engineertudent      Education: [employer] 01/2015; voice music major.    Tobacco: none      Alcohol: socially      Drugs: none      Sexual activity: dates females and males.   Family History  Problem Relation Age of Onset  . Hearing loss Mother   . Bipolar disorder Mother   . Depression Mother   . Anxiety disorder Mother   . Bipolar disorder Brother   . Anxiety disorder Brother   . Cancer Maternal Grandmother     breast cancer  . Bipolar disorder Maternal Grandmother   . Depression Maternal Grandmother   . Hearing loss Maternal Grandfather   . Diabetes Father   . Anxiety disorder Father   . Deep vein thrombosis Father     After immobilization from surgery   No Known Allergies Prior to Admission medications   Medication Sig Start Date End Date Taking? Authorizing Provider  FLUoxetine HCl 60 MG TABS Take 60 mg by mouth daily. 10/28/15   Ofilia NeasMichael L Clark, PA-C  metFORMIN (GLUCOPHAGE) 500 MG tablet TAKE 2 TABLETS (1,000 MG TOTAL) BY MOUTH 2 (TWO) TIMES DAILY WITH A MEAL. 12/28/15   Ethelda ChickKristi M Smith, MD  norgestimate-ethinyl estradiol (SPRINTEC 28) 0.25-35 MG-MCG tablet Take 1 tablet by mouth daily. 12/20/15   Magdalene RiverBrittany D Wiseman, PA-C   ROS: The patient denies  fevers, chills, night sweats, unintentional weight loss, chest pain, palpitations, wheezing, dyspnea on exertion, nausea, vomiting, abdominal pain, dysuria, hematuria, melena, numbness, weakness, or tingling. +cough, +rhinorrhea, +fatigue, +sore throat, +postnasal drip  All other systems have been reviewed and were otherwise negative with the exception of those mentioned in the HPI and as above.    PHYSICAL EXAM: Vitals:   12/28/15 1041  BP: 118/82  Pulse: (!) 101  Resp: 18  Temp: 98.7 F (37.1 C)   Body mass index is 32.65 kg/m.   General: Alert, no acute distress HEENT:  Normocephalic, atraumatic, oropharynx patent. Nasal congestion.  Eye: Nonie HoyerOMI, [facility] Cardiovascular:  Regular rate and rhythm, no rubs murmurs or gallops.  No Carotid bruits, radial pulse intact. No pedal edema.  Respiratory:  No wheezes or rales. Bilateral coarse rhonchi.  No cyanosis, no use of accessory musculature Abdominal: No organomegaly, abdomen is soft and non-tender, positive bowel sounds.  No masses. Musculoskeletal: Gait intact. No edema, tenderness Skin: No rashes. Neurologic: Facial musculature symmetric. Psychiatric: Patient acts appropriately throughout our interaction. Lymphatic: No cervical or submandibular lymphadenopathy  LABS: Results for orders placed or performed in visit on 12/28/15  POCT rapid strep A  Result Value Ref Range   Rapid Strep A Screen Negative Negative   EKG/XRAY:   Primary [facility]   ASSESSMENT/PLAN:  Patient has a productive cough. Will cover with a Z-Pak and Tessalon Perles. Strep test done was negative.I personally performed the services described in this documentation, which was scribed in my presence. The recorded information has been reviewed and is accurate.   Gross sideeffects, risk and benefits, and alternatives of medications d/w patient. Patient is aware that all medications have potential sideeffects and we are unable to predict every sideeffect or drug-drug interaction that may occur.  Lesle ChrisSteven Daub MD 12/28/2015 10:59 AM

## 2016-01-01 NOTE — Telephone Encounter (Signed)
No answer and no voice mail.

## 2016-01-02 NOTE — Telephone Encounter (Signed)
Spoke to pt and advised she needs OV for more RFs. Pt agreed to cB and sch appt.

## 2016-01-05 ENCOUNTER — Ambulatory Visit (INDEPENDENT_AMBULATORY_CARE_PROVIDER_SITE_OTHER): Payer: BC Managed Care – PPO | Admitting: Family Medicine

## 2016-01-05 ENCOUNTER — Encounter: Payer: Self-pay | Admitting: Family Medicine

## 2016-01-05 VITALS — BP 114/68 | HR 80 | Temp 98.6°F | Resp 16 | Ht 65.0 in | Wt 204.0 lb

## 2016-01-05 DIAGNOSIS — F418 Other specified anxiety disorders: Secondary | ICD-10-CM | POA: Diagnosis not present

## 2016-01-05 DIAGNOSIS — E282 Polycystic ovarian syndrome: Secondary | ICD-10-CM

## 2016-01-05 DIAGNOSIS — F329 Major depressive disorder, single episode, unspecified: Secondary | ICD-10-CM

## 2016-01-05 DIAGNOSIS — R5382 Chronic fatigue, unspecified: Secondary | ICD-10-CM

## 2016-01-05 DIAGNOSIS — J069 Acute upper respiratory infection, unspecified: Secondary | ICD-10-CM | POA: Diagnosis not present

## 2016-01-05 DIAGNOSIS — E6609 Other obesity due to excess calories: Secondary | ICD-10-CM | POA: Diagnosis not present

## 2016-01-05 DIAGNOSIS — J9801 Acute bronchospasm: Secondary | ICD-10-CM | POA: Diagnosis not present

## 2016-01-05 DIAGNOSIS — Z6833 Body mass index (BMI) 33.0-33.9, adult: Secondary | ICD-10-CM | POA: Diagnosis not present

## 2016-01-05 DIAGNOSIS — F419 Anxiety disorder, unspecified: Principal | ICD-10-CM

## 2016-01-05 LAB — CBC WITH DIFFERENTIAL/PLATELET
BASOS ABS: 0 {cells}/uL (ref 0–200)
Basophils Relative: 0 %
EOS ABS: 219 {cells}/uL (ref 15–500)
Eosinophils Relative: 3 %
HCT: 38.4 % (ref 35.0–45.0)
HEMOGLOBIN: 13.2 g/dL (ref 11.7–15.5)
LYMPHS PCT: 34 %
Lymphs Abs: 2482 cells/uL (ref 850–3900)
MCH: 29.5 pg (ref 27.0–33.0)
MCHC: 34.4 g/dL (ref 32.0–36.0)
MCV: 85.7 fL (ref 80.0–100.0)
MONO ABS: 438 {cells}/uL (ref 200–950)
MPV: 9.1 fL (ref 7.5–12.5)
Monocytes Relative: 6 %
NEUTROS PCT: 57 %
Neutro Abs: 4161 cells/uL (ref 1500–7800)
PLATELETS: 355 10*3/uL (ref 140–400)
RBC: 4.48 MIL/uL (ref 3.80–5.10)
RDW: 13.6 % (ref 11.0–15.0)
WBC: 7.3 10*3/uL (ref 3.8–10.8)

## 2016-01-05 LAB — COMPREHENSIVE METABOLIC PANEL
ALBUMIN: 4.3 g/dL (ref 3.6–5.1)
ALT: 25 U/L (ref 6–29)
AST: 20 U/L (ref 10–30)
Alkaline Phosphatase: 40 U/L (ref 33–115)
BILIRUBIN TOTAL: 0.7 mg/dL (ref 0.2–1.2)
BUN: 8 mg/dL (ref 7–25)
CHLORIDE: 107 mmol/L (ref 98–110)
CO2: 25 mmol/L (ref 20–31)
Calcium: 9.5 mg/dL (ref 8.6–10.2)
Creat: 0.62 mg/dL (ref 0.50–1.10)
Glucose, Bld: 96 mg/dL (ref 65–99)
Potassium: 4.1 mmol/L (ref 3.5–5.3)
SODIUM: 141 mmol/L (ref 135–146)
TOTAL PROTEIN: 7 g/dL (ref 6.1–8.1)

## 2016-01-05 LAB — TSH: TSH: 1.66 mIU/L

## 2016-01-05 LAB — HEMOGLOBIN A1C
Hgb A1c MFr Bld: 4.7 % (ref <5.7)
Mean Plasma Glucose: 88 mg/dL

## 2016-01-05 MED ORDER — METFORMIN HCL ER (MOD) 500 MG PO TB24: 2000.0000 mg | ORAL_TABLET | Freq: Every day | ORAL | 5 refills | Status: DC

## 2016-01-05 MED ORDER — AMOXICILLIN-POT CLAVULANATE 875-125 MG PO TABS: 1.0000 | ORAL_TABLET | Freq: Two times a day (BID) | ORAL | 0 refills | Status: DC

## 2016-01-05 MED ORDER — METFORMIN HCL 500 MG PO TABS: ORAL_TABLET | ORAL | 5 refills | Status: DC

## 2016-01-05 MED ORDER — HYDROXYZINE HCL 25 MG PO TABS: 12.5000 mg | ORAL_TABLET | Freq: Three times a day (TID) | ORAL | 1 refills | Status: DC | PRN

## 2016-01-05 MED ORDER — ALBUTEROL SULFATE HFA 108 (90 BASE) MCG/ACT IN AERS: 2.0000 | INHALATION_SPRAY | Freq: Four times a day (QID) | RESPIRATORY_TRACT | 1 refills | Status: DC | PRN

## 2016-01-05 MED ORDER — FLUOXETINE HCL 60 MG PO TABS: 60.0000 mg | ORAL_TABLET | Freq: Every day | ORAL | 5 refills | Status: DC

## 2016-01-05 NOTE — Patient Instructions (Signed)
     IF you received an x-ray today, you will receive an invoice from [facility]. Please contact [facility] at [phone] with questions or concerns regarding your invoice.   IF you received labwork today, you will receive an invoice from [facility]. Please contact [facility] at [phone] with questions or concerns regarding your invoice.   Our billing staff will not be able to assist you with questions regarding bills from these companies.  You will be contacted with the lab results as soon as they are available. The fastest way to get your results is to activate your My Chart account. Instructions are located on the last page of this paperwork. If you have not heard from us regarding the results in 2 weeks, please contact this office.      

## 2016-01-05 NOTE — Progress Notes (Signed)
Subjective:    Patient ID: Sherry Mercado, female    DOB: Oct 21, 1990, 25 y.o.   MRN: [id number]  01/05/2016  Cough (Antibx helped some but not completely) and Shortness of Breath   HPI This 25 y.o. female presents for evaluation of cough, shortness of breath. Evaluated on 12/28/15 by Dr. Cleta Albertsaub for sore throat and cough; treated with Zpack and tessalon perles.  Strep testing negative.  Felt better while taking medication.  Still coughing; still sniffling.  Feeling fatigued. No fever; no chills/sweats.  +HA.  Mild sore throat; mild ear pain R side mild and intermittent.  Congestion; rhinorrhea moderate yesterday; still coughing up a lot; coming from chest.  +SOB with ambulation and activity; onset two weeks ago.  Wheezing some. No history fo ashtma; has had episodes intermittent with wheezing. Usually after acute sickness; prescribed inhaler for two weeks.  No major allergy issues lately.  Some sneezing lately; some itchy eyes and nose.    Anxiety and depression; ups and downs since starting graduate school; just started this semester; three more semesters; father present; no motivation; lack of energy; feels anxiety related; with starting day and caryring out the day.  Even with positive events, has anxiety provoking.  Chronic fatigue.  Bedtime 10:00-11:00pm; wakes up at 7:00am.  Sleeps well.  No insomnia.  Takes naps; usually every day atleast 1 hour.  Taking Fluoxetine 60mg  daily.  Did notice with increase of Fluoxetine to 40mg  energy improved. With school, now more tired.  Increased to 60mg  but did not help energy.  No major breakdowns.  [place] a [place] [employer] [place] [employer].  This summer walked a lot; had a ton of energy this summer. Fluoxetine increased to 60mg  daily in 10/2015 by PA Clark.  Last visit with Dr. Elvera LennoxGherghe in 08/2014 for PCOS.  Recommended treated symptoms of PCOS that are most bothersome to patient which included obesity, acne, and Fertility.  Prescribed Metformin, OCP, and  referral to nutritionist.  GAD 7 : Generalized Anxiety Score 01/05/2016  Nervous, Anxious, on Edge 3  Control/stop worrying 1  Worry too much - different things 1  Trouble relaxing 0  Restless 1  Easily annoyed or irritable 0  Afraid - awful might happen 0  Total GAD 7 Score 6  Anxiety Difficulty Somewhat difficult     Wt Readings from Last 3 Encounters:  01/05/16 204 lb (92.5 kg)  12/28/15 196 lb 3.2 oz (89 kg)  12/20/15 204 lb (92.5 kg)    BP Readings from Last 3 Encounters:  01/05/16 114/68  12/28/15 118/82  12/20/15 120/82    Review of Systems  Constitutional: Positive for fatigue. Negative for chills, diaphoresis and fever.  HENT: Positive for congestion, ear pain, postnasal drip, rhinorrhea, sinus pain, sinus pressure and sneezing.   Eyes: Negative for visual disturbance.  Respiratory: Positive for cough and wheezing. Negative for shortness of breath.   Cardiovascular: Negative for chest pain, palpitations and leg swelling.  Gastrointestinal: Negative for abdominal pain, constipation, diarrhea, nausea and vomiting.  Endocrine: Negative for cold intolerance, heat intolerance, polydipsia, polyphagia and polyuria.  Neurological: Negative for dizziness, tremors, seizures, syncope, facial asymmetry, speech difficulty, weakness, light-headedness, numbness and headaches.  Psychiatric/Behavioral: Negative for dysphoric mood, self-injury, sleep disturbance and suicidal ideas. The patient is nervous/anxious.     Past Medical History:  Diagnosis Date  . Allergy    Zyrtec PRN  . Anxiety   . Depression    Past Surgical History:  Procedure Laterality Date  . EYE SURGERY  tear duct procedure   No Known Allergies  Social History   Social History  . Marital status: Single    Spouse name: n/a  . Number of children: 0  . Years of education: college   Occupational History  . singer    Social History Main Topics  . Smoking status: Never Smoker  . Smokeless  tobacco: Not on file  . Alcohol use 0.0 oz/week     Comment: social  . Drug use: No  . Sexual activity: Yes    Partners: Male    Birth control/ protection: Condom   Other Topics Concern  . Not on file   Social History Narrative   Marital status: single; not dating seriously.     Children: none      Lives: with parents, brothers      Employment:  Consulting civil engineertudent      Education: [employer] 01/[employer]; voice music major.       Tobacco: none      Alcohol: socially      Drugs: none      Sexual activity: dates females and males.   Family History  Problem Relation Age of Onset  . Hearing loss Mother   . Bipolar disorder Mother   . Depression Mother   . Anxiety disorder Mother   . Bipolar disorder Brother   . Anxiety disorder Brother   . Cancer Maternal Grandmother     breast cancer  . Bipolar disorder Maternal Grandmother   . Depression Maternal Grandmother   . Hearing loss Maternal Grandfather   . Diabetes Father   . Anxiety disorder Father   . Deep vein thrombosis Father     After immobilization from surgery       Objective:    BP 114/68   Pulse 80   Temp 98.6 F (37 C) (Oral)   Resp 16   Ht 5\' 5"  (1.651 m)   Wt 204 lb (92.5 kg)   LMP 12/10/2015   SpO2 99%   BMI 33.95 kg/m  Physical Exam  Constitutional: She is oriented to person, place, and time. She appears well-developed and well-nourished. No distress.  HENT:  Head: Normocephalic and atraumatic.  Right Ear: Tympanic membrane, external ear and ear canal normal.  Left Ear: Tympanic membrane, external ear and ear canal normal.  Nose: Nose normal.  Mouth/Throat: Uvula is midline, oropharynx is clear and moist and mucous membranes are normal.  Eyes: Conjunctivae and EOM are normal. Pupils are equal, round, and reactive to light.  Neck: Normal range of motion. Neck supple. Carotid bruit is not present. No thyromegaly present.  Cardiovascular: Normal rate, regular rhythm, normal heart sounds and intact distal  pulses.  Exam reveals no gallop and no friction rub.   No murmur heard. Pulmonary/Chest: Effort normal and breath sounds normal. She has no wheezes. She has no rales.  Lymphadenopathy:    She has no cervical adenopathy.  Neurological: She is alert and oriented to person, place, and time. No cranial nerve deficit.  Skin: Skin is warm and dry. No rash noted. She is not diaphoretic. No erythema. No pallor.  Psychiatric: She has a normal mood and affect. Her behavior is normal. Judgment and thought content normal.   Results for orders placed or performed in visit on 12/28/15  POCT rapid strep A  Result Value Ref Range   Rapid Strep A Screen Negative Negative       Assessment & Plan:   1. Anxiety and depression   2. Acute  upper respiratory infection   3. Bronchospasm   4. PCOS (polycystic ovarian syndrome)   5. Chronic fatigue   6. Class 1 obesity due to excess calories with serious comorbidity and body mass index (BMI) of 33.0 to 33.9 in adult    -persistent; add Albuterol HFA every 6 hours for cough and wheezing; add Augmentin for residual sinusitis.  Continue Tessalon Perles.  Can add Prednisone if symptoms persist. -anxiety moderately controlled; continue Prozac 60mg  daily; add hydroxyzine PRN anxiety.  Recommend daily exercise; also recommend initiating counseling through graduate school.   -change Metformin to Metformin ER 2000mg  daily; suffers with nausea with metformin; obtain labs; s/p endocrinology consultation in 2016 who confirmed PCOS.  -consider sleep study due to ongoing chronic fatigue that improved with increase in Fluoxetine temporarily but now suffering with fatigue again.  Orders Placed This Encounter  Procedures  . CBC with Differential/Platelet  . Comprehensive metabolic panel  . Hemoglobin A1c  . TSH   Meds ordered this encounter  Medications  . hydrOXYzine (ATARAX/VISTARIL) 25 MG tablet    Sig: Take 0.5-1 tablets (12.5-25 mg total) by mouth every 8 (eight)  hours as needed for anxiety.    Dispense:  30 tablet    Refill:  1  . FLUoxetine HCl 60 MG TABS    Sig: Take 60 mg by mouth daily.    Dispense:  90 tablet    Refill:  5  . DISCONTD: metFORMIN (GLUCOPHAGE) 500 MG tablet    Sig: TAKE 2 TABLETS (1,000 MG TOTAL) BY MOUTH 2 (TWO) TIMES DAILY WITH A MEAL.    Dispense:  120 tablet    Refill:  5  . albuterol (PROVENTIL HFA;VENTOLIN HFA) 108 (90 Base) MCG/ACT inhaler    Sig: Inhale 2 puffs into the lungs every 6 (six) hours as needed for wheezing or shortness of breath (cough, shortness of breath or wheezing.).    Dispense:  1 Inhaler    Refill:  1  . amoxicillin-clavulanate (AUGMENTIN) 875-125 MG tablet    Sig: Take 1 tablet by mouth 2 (two) times daily.    Dispense:  20 tablet    Refill:  0  . metFORMIN (GLUMETZA) 500 MG (MOD) 24 hr tablet    Sig: Take 4 tablets (2,000 mg total) by mouth daily with breakfast.    Dispense:  120 tablet    Refill:  5    Return in about 3 months (around 04/06/2016) for recheck.   Kristi Paulita Fujita, M.D. [facility] [address] [place], [place]  [address] [phone] phone [phone] fax

## 2016-01-18 ENCOUNTER — Telehealth: Payer: Self-pay

## 2016-01-18 MED ORDER — METFORMIN HCL ER 500 MG PO TB24
ORAL_TABLET | ORAL | 1 refills | Status: DC
Start: 2016-01-18 — End: 2016-12-25

## 2016-01-18 NOTE — Telephone Encounter (Signed)
Received PA req for metformin ER. The incorrect Rx was sent in originally, should have chosen the Glucophage type of metformin ER which comes in generic. Checked w/pharm and he DCd the original and asked that I re-send the correct type. Done.

## 2016-01-31 ENCOUNTER — Ambulatory Visit (INDEPENDENT_AMBULATORY_CARE_PROVIDER_SITE_OTHER): Payer: BC Managed Care – PPO | Admitting: Family Medicine

## 2016-01-31 VITALS — BP 118/84 | HR 98 | Temp 98.5°F | Resp 17 | Ht 65.0 in | Wt 200.0 lb

## 2016-01-31 DIAGNOSIS — J029 Acute pharyngitis, unspecified: Secondary | ICD-10-CM | POA: Diagnosis not present

## 2016-01-31 LAB — POCT RAPID STREP A (OFFICE): RAPID STREP A SCREEN: NEGATIVE

## 2016-01-31 MED ORDER — AMOXICILLIN 875 MG PO TABS: 875.0000 mg | ORAL_TABLET | Freq: Two times a day (BID) | ORAL | 0 refills | Status: DC

## 2016-01-31 NOTE — Progress Notes (Signed)
   Sherry HeadsKayla Struve is a 25 y.o. female who presents to [facility] today for sore throat:  1.  Sore throat:  Present since Sunday. She's not having any other URI symptoms. She denies any runny nose, cough, sinus congestion. The sore throat is fairly constant. Is worse in the mornings when she wakes up. She is able to eat and drink well. She is staying hydrated. She has no fevers or chills.  ROS as above.  No abd pain/N/V  PMH reviewed. Patient is a nonsmoker.   Past Medical History:  Diagnosis Date  . Allergy    Zyrtec PRN  . Anxiety   . Depression    Past Surgical History:  Procedure Laterality Date  . EYE SURGERY     tear duct procedure    Medications reviewed. Current Outpatient Prescriptions  Medication Sig Dispense Refill  . FLUoxetine HCl 60 MG TABS Take 60 mg by mouth daily. 90 tablet 5  . hydrOXYzine (ATARAX/VISTARIL) 25 MG tablet Take 0.5-1 tablets (12.5-25 mg total) by mouth every 8 (eight) hours as needed for anxiety. 30 tablet 1  . metFORMIN (GLUCOPHAGE-XR) 500 MG 24 hr tablet Take 4 tablets (2,000 mg total) by mouth daily with breakfast. 360 tablet 1  . norgestimate-ethinyl estradiol (SPRINTEC 28) 0.25-35 MG-MCG tablet Take 1 tablet by mouth daily. 84 tablet 3   No current facility-administered medications for this visit.      Physical Exam:  BP 118/84 (BP Location: Left Arm, Patient Position: Sitting, Cuff Size: Large)   Pulse 98   Temp 98.5 F (36.9 C) (Oral)   Resp 17   Ht 5\' 5"  (1.651 m)   Wt 200 lb (90.7 kg)   LMP 01/17/2016 (Approximate)   SpO2 99%   BMI 33.28 kg/m  Gen:  Patient sitting on exam table, appears stated age in no acute distress Head: Normocephalic atraumatic Eyes: EOMI, PERRL, sclera and conjunctiva non-erythematous Ears:  Canals clear bilaterally.  TMs pearly gray bilaterally without erythema or bulging.   Nose:  Nares patent BL Mouth: Mucosa membranes moist. Tonsils +3, erythematous, edematous. She also has  bilateral exudates noted. Heart:  RRR, no murmurs auscultated. Pulm:  Clear to auscultation bilaterally with good air movement.  No wheezes or rales noted.     Assessment and Plan:  1.  Pharyngitis: -Strep was negative.. -However she does appear to have all the stigmata of strep throat. Her Centor criteria is high. -We are going to treat her today with amoxicillin with for presumed strep. -Sending for strep culture. -Viral pharyngitis another possibility. We'll call with results of culture.

## 2016-01-31 NOTE — Patient Instructions (Addendum)
  Your rapid strep test was negative.  I still believe you have strep throat.  We're sending it away for culture.  I'll plant to treat you with amoxicillin. Take this twice daily for the next 10 days.  Let us know if you not getting better in the next couple days.  Have a good holiday and New Year!   IF you received an x-ray today, you will receive an invoice from [facility]. Please contact [facility] at [phone] with questions or concerns regarding your invoice.   IF you received labwork today, you will receive an invoice from [facility]. Please contact [facility] at [phone] with questions or concerns regarding your invoice.   Our billing staff will not be able to assist you with questions regarding bills from these companies.  You will be contacted with the lab results as soon as they are available. The fastest way to get your results is to activate your My Chart account. Instructions are located on the last page of this paperwork. If you have not heard from us regarding the results in 2 weeks, please contact this office.

## 2016-02-03 LAB — CULTURE, GROUP A STREP

## 2016-03-21 ENCOUNTER — Ambulatory Visit (INDEPENDENT_AMBULATORY_CARE_PROVIDER_SITE_OTHER): Payer: BC Managed Care – PPO | Admitting: Physician Assistant

## 2016-03-21 VITALS — BP 110/70 | HR 95 | Resp 16 | Ht 65.0 in | Wt 203.4 lb

## 2016-03-21 DIAGNOSIS — J029 Acute pharyngitis, unspecified: Secondary | ICD-10-CM | POA: Diagnosis not present

## 2016-03-21 LAB — POCT CBC
Granulocyte percent: 69 %G (ref 37–80)
HCT, POC: 38.9 % (ref 37.7–47.9)
Hemoglobin: 13.8 g/dL (ref 12.2–16.2)
LYMPH, POC: 2.5 (ref 0.6–3.4)
MCH, POC: 30.3 pg (ref 27–31.2)
MCHC: 35.4 g/dL (ref 31.8–35.4)
MCV: 85.6 fL (ref 80–97)
MID (cbc): 0.5 (ref 0–0.9)
MPV: 6.8 fL (ref 0–99.8)
PLATELET COUNT, POC: 279 10*3/uL (ref 142–424)
POC Granulocyte: 6.8 (ref 2–6.9)
POC LYMPH %: 25.6 % (ref 10–50)
POC MID %: 5.4 %M (ref 0–12)
RBC: 4.54 M/uL (ref 4.04–5.48)
RDW, POC: 12.9 %
WBC: 9.9 10*3/uL (ref 4.6–10.2)

## 2016-03-21 LAB — POCT RAPID STREP A (OFFICE): RAPID STREP A SCREEN: NEGATIVE

## 2016-03-21 MED ORDER — LIDOCAINE VISCOUS 2 % MT SOLN: 5.0000 mL | OROMUCOSAL | 0 refills | Status: DC | PRN

## 2016-03-21 NOTE — Progress Notes (Signed)
03/25/2016 2:32 PM   DOB: 1990/09/15 / MRN: [id number]  SUBJECTIVE:  Sherry Mercado is a 26 y.o. female presenting for sore throat that started yesterday and she associates post nasal drip.  Reports that she feels fine otherwise.  She has been told "for years" that her tonsils are enlarged.  She does have a history of snoring and her friends often comment on this and father says this has been present since high school. He says that she never stops breathing.    She has No Known Allergies.   She  has a past medical history of Allergy; Anxiety; and Depression.    She  reports that she has never smoked. She has never used smokeless tobacco. She reports that she drinks alcohol. She reports that she does not use drugs. She  reports that she currently engages in sexual activity and has had female partners. She reports using the following method of birth control/protection: Condom. The patient  has a past surgical history that includes Eye surgery.  Her family history includes Anxiety disorder in her brother, father, and mother; Bipolar disorder in her brother, maternal grandmother, and mother; Cancer in her maternal grandmother; Deep vein thrombosis in her father; Depression in her maternal grandmother and mother; Diabetes in her father; Hearing loss in her maternal grandfather and mother.  Review of Systems  Constitutional: Negative for chills and fever.  Cardiovascular: Negative for chest pain.  Gastrointestinal: Negative for nausea.  Skin: Negative for itching and rash.  Neurological: Negative for dizziness.  Psychiatric/Behavioral: Negative for depression.    The problem list and medications were reviewed and updated by myself where necessary and exist elsewhere in the encounter.   OBJECTIVE:  BP 110/70   Pulse 95   Resp 16   Ht 5\' 5"  (1.651 m)   Wt 203 lb 6.4 oz (92.3 kg)   LMP 03/15/2016   SpO2 98%   BMI 33.85 kg/m   Pulse Readings from Last 3 Encounters:  03/21/16 95  01/31/16  98  01/05/16 80   Physical Exam  Constitutional: She is oriented to person, place, and time.  HENT:  Mouth/Throat: Uvula is midline, oropharynx is clear and moist and mucous membranes are normal.    Cardiovascular: Normal rate and regular rhythm.   Pulmonary/Chest: Effort normal and breath sounds normal.  Musculoskeletal: Normal range of motion.  Neurological: She is alert and oriented to person, place, and time. No cranial nerve deficit. Coordination normal.    No results found for this or any previous visit (from the past 72 hour(s)).  No results found.  ASSESSMENT AND PLAN:  Sherry Mercado was seen today for sore throat.  Diagnoses and all orders for this visit:  Sore throat: She did test postive for non group A strep.  Will treat with augmentin given she was recently treated with amox. If she continues to have problems with her throat it may be best she talk with ENT about tonsillectomy.  -     POCT rapid strep A -     Culture, Group A Strep -     POCT CBC -     Epstein-Barr virus VCA antibody panel -     lidocaine (XYLOCAINE) 2 % solution; Use as directed 5-10 mLs in the mouth or throat as needed for mouth pain.    The patient is advised to call or return to clinic if she does not see an improvement in symptoms, or to seek the care of the closest emergency department if she  worsens with the above plan.   Deliah BostonMichael Clark, MHS, PA-C [facility] 03/25/2016 2:32 PM

## 2016-03-21 NOTE — Patient Instructions (Addendum)
Take 2 aleve in the morning and 2 at night.      IF you received an x-ray today, you will receive an invoice from [facility]. Please contact [facility] at [phone] with questions or concerns regarding your invoice.   IF you received labwork today, you will receive an invoice from [facility]. Please contact [facility] at [phone] with questions or concerns regarding your invoice.   Our billing staff will not be able to assist you with questions regarding bills from these companies.  You will be contacted with the lab results as soon as they are available. The fastest way to get your results is to activate your My Chart account. Instructions are located on the last page of this paperwork. If you have not heard from us regarding the results in 2 weeks, please contact this office.

## 2016-03-23 LAB — EPSTEIN-BARR VIRUS VCA ANTIBODY PANEL
EBV EARLY ANTIGEN AB, IGG: 40.2 U/mL — AB (ref 0.0–8.9)
EBV NA IgG: 361 U/mL — ABNORMAL HIGH (ref 0.0–17.9)

## 2016-03-24 LAB — CULTURE, GROUP A STREP

## 2016-03-25 NOTE — Progress Notes (Signed)
No need to call. Augmentin called into pharmacy given postive strep.  Patient made aware via telephone call.

## 2016-05-10 ENCOUNTER — Encounter: Payer: Self-pay | Admitting: Family Medicine

## 2016-06-07 IMAGING — US US PELVIS COMPLETE
1 series · 14 of 25 positions shown · non-contrast
Comparison: None

CLINICAL DATA: Irregular menses.

EXAM:
TRANSABDOMINAL AND TRANSVAGINAL ULTRASOUND OF PELVIS
TECHNIQUE: Both transabdominal and transvaginal ultrasound examinations of the
pelvis were performed. Transabdominal technique was performed for
global imaging of the pelvis including uterus, ovaries, adnexal
regions, and pelvic cul-de-sac. It was necessary to proceed with
endovaginal exam following the transabdominal exam to visualize the
endometrium and ovaries..

[Series 1: us pelvis complete · 0.22mm/px · 14 of 53 slices shown]
[im 1/53]
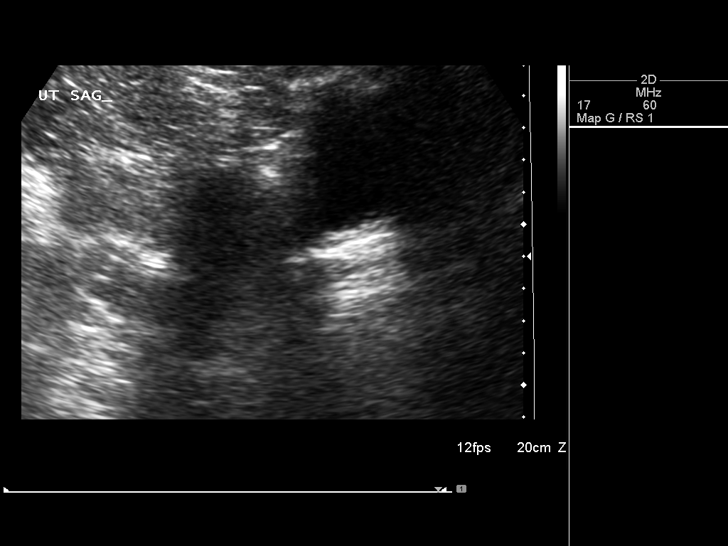
[im 5/53]
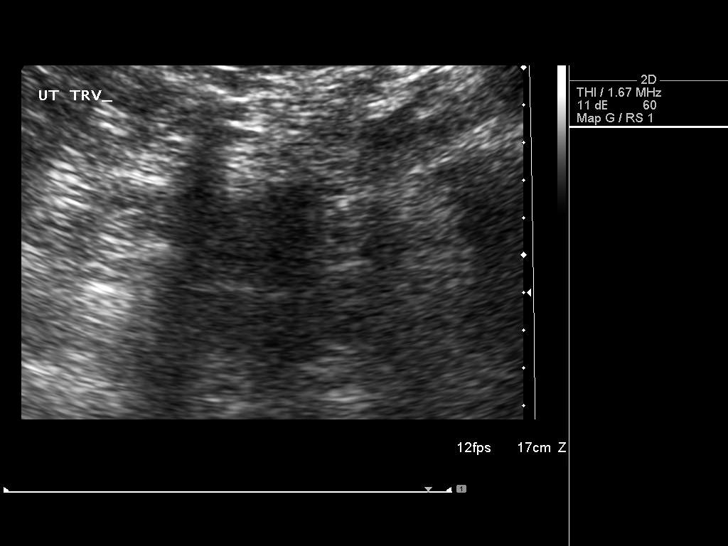
[im 9/53]
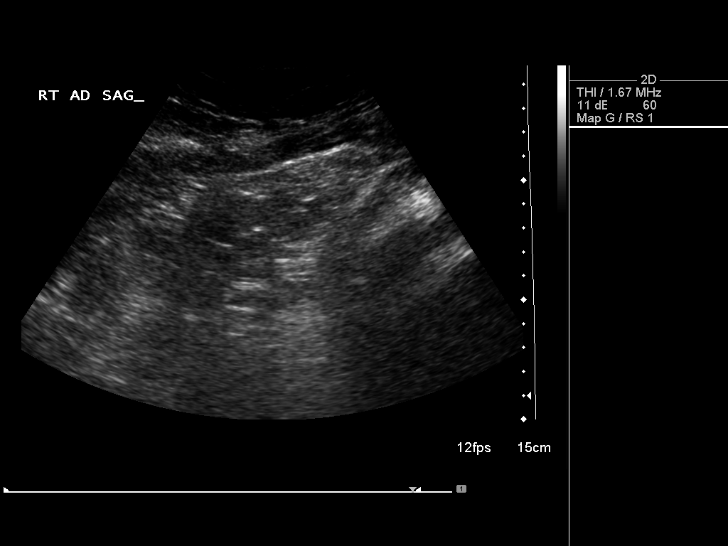
[im 14/53]
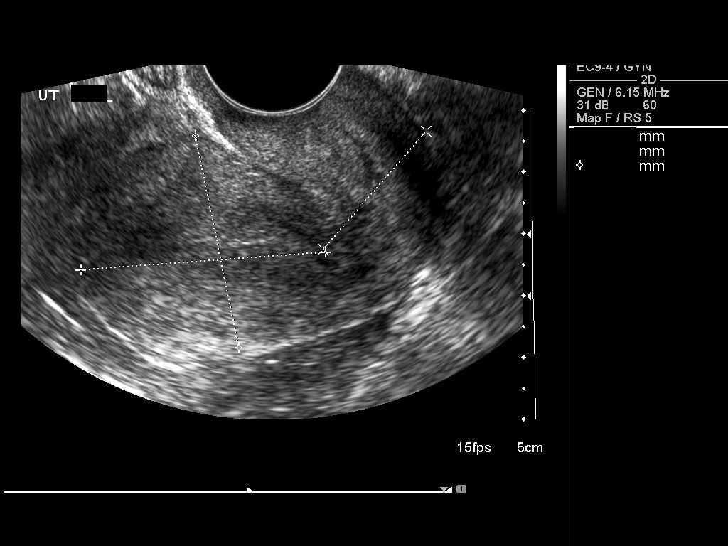
[im 18/53]
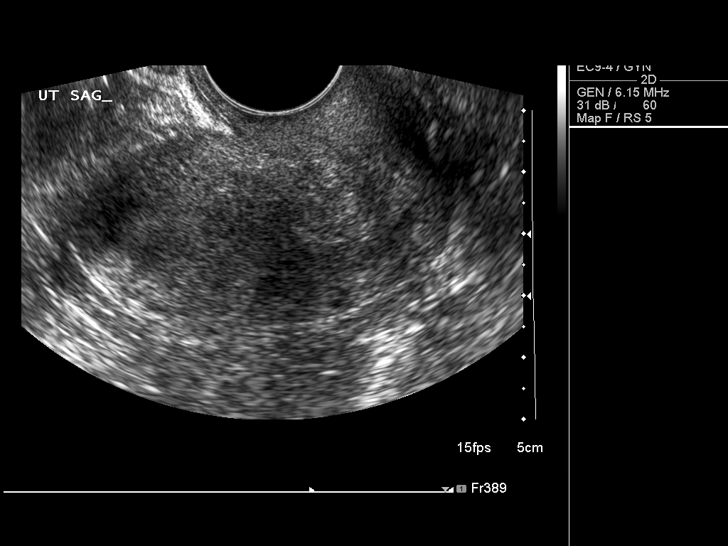
[im 20/53]
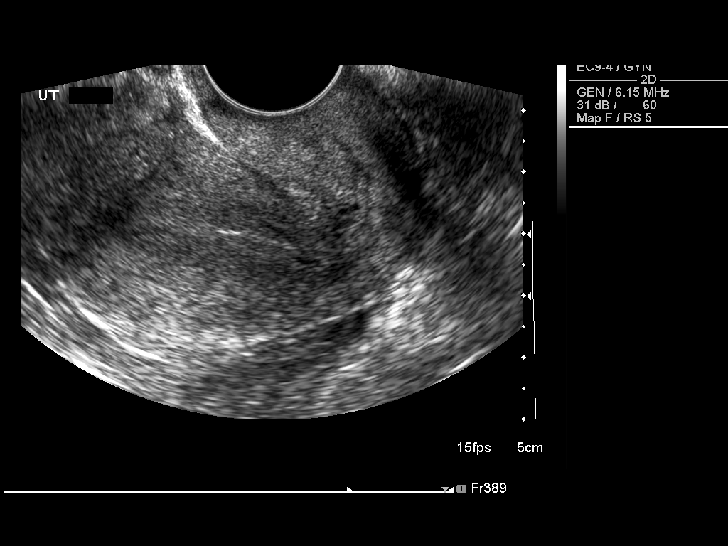
[im 24/53]
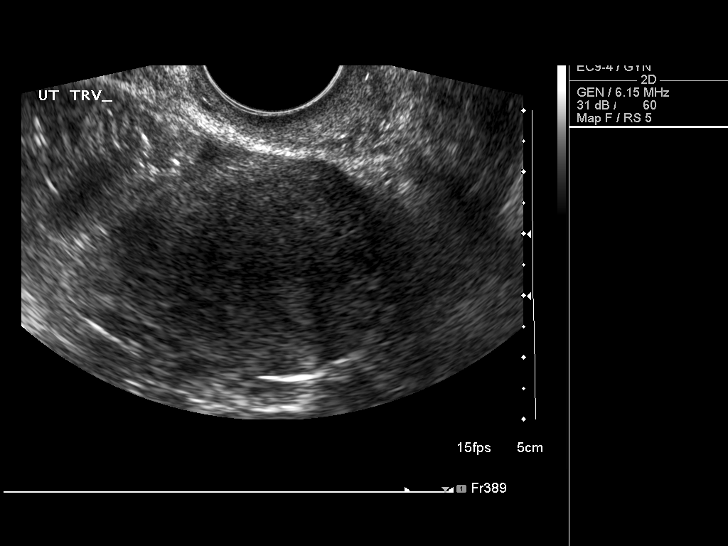
[im 29/53]
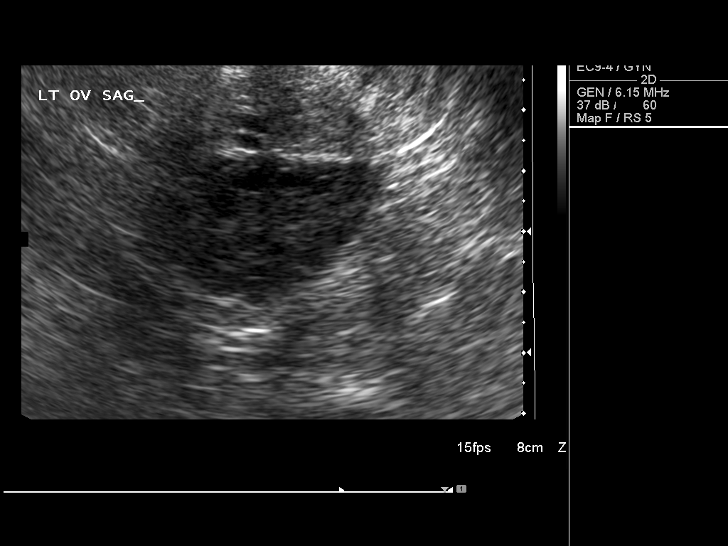
[im 33/53]
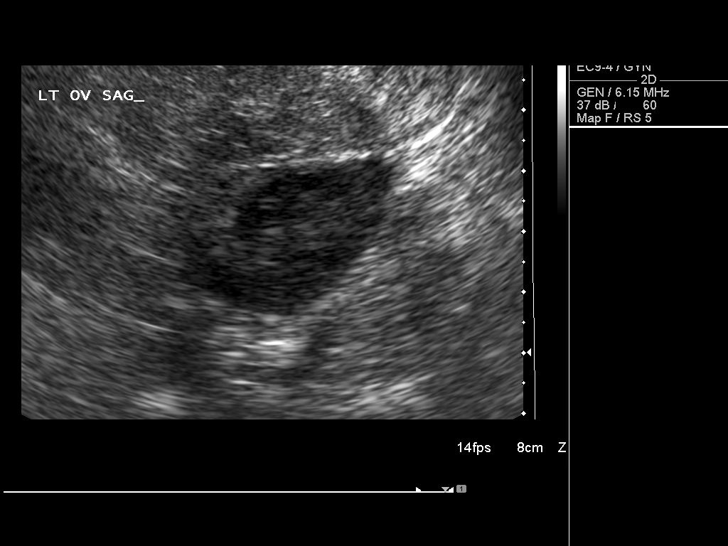
[im 35/53]
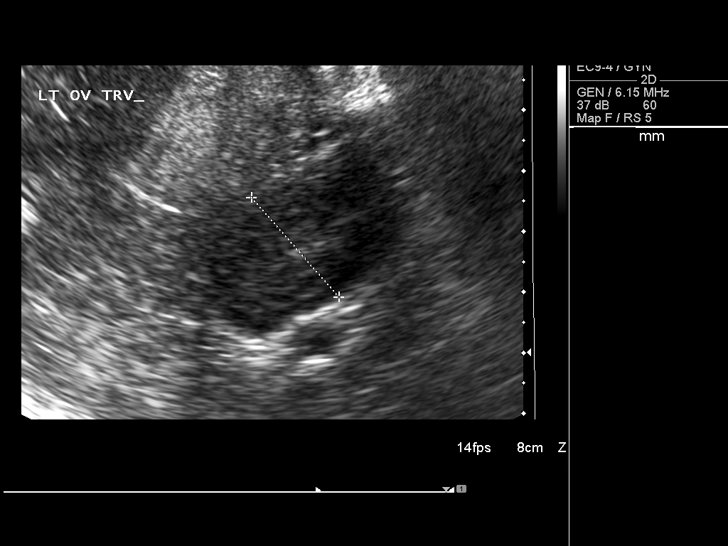
[im 40/53]
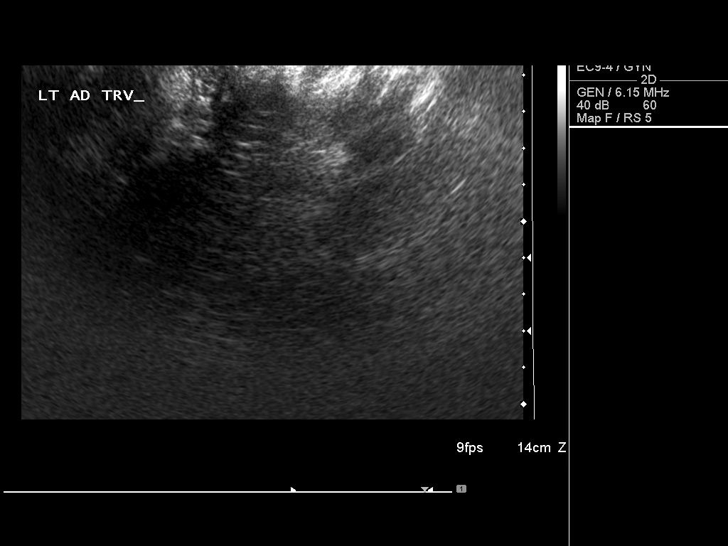
[im 44/53]
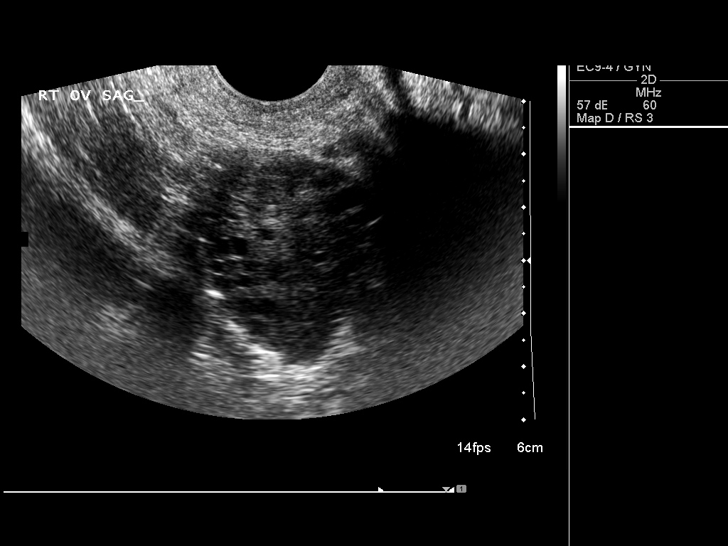
[im 48/53]
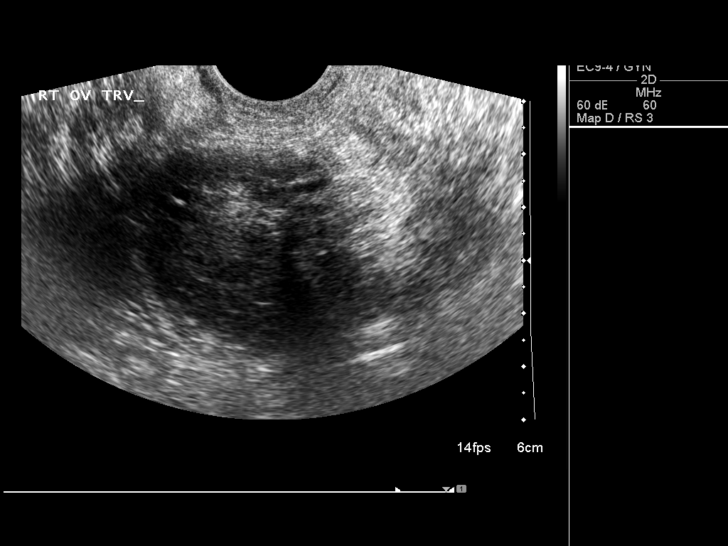
[im 53/53]
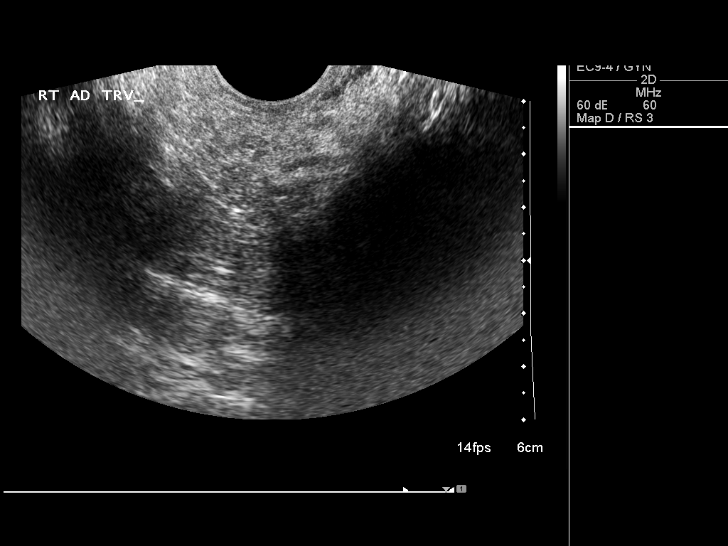

[14 of 25 positions shown; findings below may reference images not displayed]

FINDINGS: Uterus

Measurements: 6.5 x 3.5 x 4.3 cm. No fibroids or other mass
visualized.

Endometrium

Thickness: 3.2 mm.  No focal abnormality visualized.

Right ovary

Measurements: 4.1 x 3.1 x 2.3 cm. Normal appearance/no adnexal mass.

Left ovary

Measurements: 3.7 x 2.2 x 2.2 cm. Normal appearance/no adnexal mass.

Other findings

No free fluid.
IMPRESSION: Normal pelvic ultrasound.

## 2016-08-16 ENCOUNTER — Other Ambulatory Visit: Payer: Self-pay | Admitting: Physician Assistant

## 2016-08-16 DIAGNOSIS — F329 Major depressive disorder, single episode, unspecified: Secondary | ICD-10-CM

## 2016-08-16 DIAGNOSIS — F32A Depression, unspecified: Secondary | ICD-10-CM

## 2016-08-16 DIAGNOSIS — F419 Anxiety disorder, unspecified: Principal | ICD-10-CM

## 2016-08-16 NOTE — Telephone Encounter (Signed)
Call --- I refilled Fluoxetine for one month supply only as patient is overdue for six month follow-up; last OV with me in 10/2015. Please schedule follow-up visit with me in upcoming month.

## 2016-08-16 NOTE — Telephone Encounter (Signed)
Please advise 

## 2016-08-20 ENCOUNTER — Telehealth: Payer: Self-pay | Admitting: Family Medicine

## 2016-08-20 NOTE — Telephone Encounter (Signed)
lmom to call an set up an OV for a f/u 6 month and med refill 

## 2016-08-20 NOTE — Telephone Encounter (Signed)
lmom to call an set up an OV for a f/u 6 month and med refill

## 2016-09-18 ENCOUNTER — Other Ambulatory Visit: Payer: Self-pay | Admitting: Family Medicine

## 2016-09-18 DIAGNOSIS — F329 Major depressive disorder, single episode, unspecified: Secondary | ICD-10-CM

## 2016-09-18 DIAGNOSIS — F419 Anxiety disorder, unspecified: Principal | ICD-10-CM

## 2016-09-27 ENCOUNTER — Ambulatory Visit: Payer: BC Managed Care – PPO | Admitting: Family Medicine

## 2016-10-26 ENCOUNTER — Other Ambulatory Visit: Payer: Self-pay | Admitting: Family Medicine

## 2016-10-26 DIAGNOSIS — F329 Major depressive disorder, single episode, unspecified: Secondary | ICD-10-CM

## 2016-10-26 DIAGNOSIS — F32A Depression, unspecified: Secondary | ICD-10-CM

## 2016-10-26 DIAGNOSIS — F419 Anxiety disorder, unspecified: Principal | ICD-10-CM

## 2016-10-30 ENCOUNTER — Encounter: Payer: Self-pay | Admitting: Family Medicine

## 2016-10-30 ENCOUNTER — Ambulatory Visit (INDEPENDENT_AMBULATORY_CARE_PROVIDER_SITE_OTHER): Payer: BC Managed Care – PPO | Admitting: Family Medicine

## 2016-10-30 VITALS — BP 129/83 | HR 108 | Temp 98.1°F | Resp 16 | Ht 65.5 in | Wt 215.2 lb

## 2016-10-30 DIAGNOSIS — E282 Polycystic ovarian syndrome: Secondary | ICD-10-CM | POA: Diagnosis not present

## 2016-10-30 DIAGNOSIS — F329 Major depressive disorder, single episode, unspecified: Secondary | ICD-10-CM

## 2016-10-30 DIAGNOSIS — Z7251 High risk heterosexual behavior: Secondary | ICD-10-CM

## 2016-10-30 DIAGNOSIS — Z309 Encounter for contraceptive management, unspecified: Secondary | ICD-10-CM

## 2016-10-30 DIAGNOSIS — F419 Anxiety disorder, unspecified: Secondary | ICD-10-CM | POA: Diagnosis not present

## 2016-10-30 DIAGNOSIS — F32A Depression, unspecified: Secondary | ICD-10-CM

## 2016-10-30 MED ORDER — NORGESTIMATE-ETH ESTRADIOL 0.25-35 MG-MCG PO TABS: 1.0000 | ORAL_TABLET | Freq: Every day | ORAL | 3 refills | Status: DC

## 2016-10-30 MED ORDER — FLUOXETINE HCL 60 MG PO TABS: 60.0000 mg | ORAL_TABLET | Freq: Every day | ORAL | 5 refills | Status: DC

## 2016-10-30 MED ORDER — DULAGLUTIDE 0.75 MG/0.5ML ~~LOC~~ SOAJ: 0.7500 mg | SUBCUTANEOUS | 11 refills | Status: DC

## 2016-10-30 NOTE — Progress Notes (Signed)
Subjective:    Patient ID: Sherry Mercado, female    DOB: [birth year], 26 y.o.   MRN: [id number]  10/30/2016  medication refills (fluoxentine, sprintec and wants to discuss the metformin)   HPI This 26 y.o. female presents for evaluation of anxiety/depression, PCOS.  Masters program; should graduate in the spring 2019.  Really loving classes.  Tinks are mostly fine; always rough patches.  Really happy right now.  Best friend moved back from [place].  Boyfriend who is really happy with.  Relationships iwht borthersa re well Has been taking 92m Prozac daily.    Has not been taking Metformin; gets crazy nausea and diarrhea; does have an easier time keeping weight down; feels more consistent emotionally.   BP Readings from Last 3 Encounters:  10/30/16 129/83  03/21/16 110/70  01/31/16 118/84   Wt Readings from Last 3 Encounters:  10/30/16 215 lb 3.2 oz (97.6 kg)  03/21/16 203 lb 6.4 oz (92.3 kg)  01/31/16 200 lb (90.7 kg)   Immunization History  Administered Date(s) Administered  . DTP 10/07/1991, 11/22/1991, 02/07/1992, 11/24/1996  . Hepatitis A 11/09/2008  . Hepatitis B 09/04/1991, 02/07/1992, 08/07/1995  . HiB (PRP-OMP) 10/07/1991, 11/22/1991, 02/07/1992, 11/06/1992  . Hpv 09/18/2006, 12/02/2006, 04/17/2007  . MMR 11/06/1992, 11/24/1996  . Meningococcal Polysaccharide 11/09/2008  . OPV 10/07/1991, 11/22/1991, 12/07/1992, 11/24/1996    Review of Systems  Constitutional: Negative for chills, diaphoresis, fatigue and fever.  Eyes: Negative for visual disturbance.  Respiratory: Negative for cough and shortness of breath.   Cardiovascular: Negative for chest pain, palpitations and leg swelling.  Gastrointestinal: Negative for abdominal pain, constipation, diarrhea, nausea and vomiting.  Endocrine: Negative for cold intolerance, heat intolerance, polydipsia, polyphagia and polyuria.  Neurological: Negative for dizziness, tremors, seizures, syncope, facial asymmetry, speech  difficulty, weakness, light-headedness, numbness and headaches.  Psychiatric/Behavioral: Negative for dysphoric mood and sleep disturbance. The patient is nervous/anxious.     Past Medical History:  Diagnosis Date  . Allergy    Zyrtec PRN  . Anxiety   . Depression    Past Surgical History:  Procedure Laterality Date  . EYE SURGERY     tear duct procedure   No Known Allergies  Social History   Social History  . Marital status: Single    Spouse name: n/a  . Number of children: 0  . Years of education: college   Occupational History  . singer    Social History Main Topics  . Smoking status: Never Smoker  . Smokeless tobacco: Never Used  . Alcohol use 0.0 oz/week     Comment: social  . Drug use: No  . Sexual activity: Yes    Partners: Male    Birth control/ protection: Condom   Other Topics Concern  . Not on file   Social History Narrative   Marital status: single; not dating seriously.     Children: none      Lives: with dad in 2018; parents separated in 2018.  Brothers go back and forth (23, 21, 149.      Employment:  [employer] broker     Education: [employer]; voice music major.       Tobacco: none      Alcohol: socially      Drugs: none      Sexual activity: dates females and males.   Family History  Problem Relation Age of Onset  . Hearing loss Mother   . Bipolar disorder Mother   . Depression Mother   . Anxiety  disorder Mother   . Bipolar disorder Brother   . Anxiety disorder Brother   . Cancer Maternal Grandmother        breast cancer  . Bipolar disorder Maternal Grandmother   . Depression Maternal Grandmother   . Hearing loss Maternal Grandfather   . Diabetes Father   . Anxiety disorder Father   . Deep vein thrombosis Father        After immobilization from surgery       Objective:    BP 129/83 (BP Location: Right Arm, Patient Position: Sitting, Cuff Size: Large)   Pulse (!) 108   Temp 98.1 F (36.7 C) (Oral)   Resp 16   Ht  5' 5.5" (1.664 m)   Wt 215 lb 3.2 oz (97.6 kg)   LMP 10/16/2016   SpO2 98%   BMI 35.27 kg/m  Physical Exam  Constitutional: She is oriented to person, place, and time. She appears well-developed and well-nourished. No distress.  HENT:  Head: Normocephalic and atraumatic.  Right Ear: External ear normal.  Left Ear: External ear normal.  Nose: Nose normal.  Mouth/Throat: Oropharynx is clear and moist.  Eyes: Pupils are equal, round, and reactive to light. Conjunctivae and EOM are normal.  Neck: Normal range of motion. Neck supple. Carotid bruit is not present. No thyromegaly present.  Cardiovascular: Normal rate, regular rhythm, normal heart sounds and intact distal pulses.  Exam reveals no gallop and no friction rub.   No murmur heard. Pulmonary/Chest: Effort normal and breath sounds normal. She has no wheezes. She has no rales.  Abdominal: Soft. Bowel sounds are normal. She exhibits no distension and no mass. There is no tenderness. There is no rebound and no guarding.  Lymphadenopathy:    She has no cervical adenopathy.  Neurological: She is alert and oriented to person, place, and time. No cranial nerve deficit.  Skin: Skin is warm and dry. No rash noted. She is not diaphoretic. No erythema. No pallor.  Psychiatric: She has a normal mood and affect. Her behavior is normal.    No results found. Depression screen [facility] 2/9 10/30/2016 03/21/2016 01/31/2016 12/28/2015 12/20/2015  Decreased Interest 0 0 0 0 0  Down, Depressed, Hopeless 0 0 0 0 0  PHQ - 2 Score 0 0 0 0 0  Altered sleeping - - - - -  Tired, decreased energy - - - - -  Change in appetite - - - - -  Feeling bad or failure about yourself  - - - - -  Trouble concentrating - - - - -  Moving slowly or fidgety/restless - - - - -  Suicidal thoughts - - - - -  PHQ-9 Score - - - - -   Fall Risk  10/30/2016 03/21/2016 01/31/2016 12/28/2015 12/20/2015  Falls in the past year? No No No No No    GAD 7 : Generalized Anxiety Score  10/30/2016 01/05/2016  Nervous, Anxious, on Edge 1 3  Control/stop worrying 0 1  Worry too much - different things 0 1  Trouble relaxing 0 0  Restless 0 1  Easily annoyed or irritable 0 0  Afraid - awful might happen 0 0  Total GAD 7 Score 1 6  Anxiety Difficulty Somewhat difficult Somewhat difficult        Assessment & Plan:   1. PCOS (polycystic ovarian syndrome)   2. Anxiety and depression   3. Encounter for contraceptive management, unspecified type    -anxiety and depression are improved; refill of  fluoxetine 32m daily provided; rtc six months.  Coping with parents' separation well. -refill of Sprintec provided for normalization of menses and for contraception; overdue for pap smear; will schedule for CPE at next visit.  Now sexually active. -obtain labs for PCOS; not tolerating Metformin due to GI side effects.  Rx for Trulicity provided to assist with insulin resistance and weight loss.   Orders Placed This Encounter  Procedures  . CBC with Differential/Platelet  . Comprehensive metabolic panel    Order Specific Question:   Has the patient fasted?    Answer:   Yes  . Hemoglobin A1c  . TSH  . Lipid panel    Order Specific Question:   Has the patient fasted?    Answer:   Yes   Meds ordered this encounter  Medications  . FLUoxetine HCl 60 MG TABS    Sig: Take 60 mg by mouth daily.    Dispense:  30 tablet    Refill:  5  . Dulaglutide (TRULICITY) 07.07MEM/7.5QGSOPN    Sig: Inject 0.75 mg into the skin once a week.    Dispense:  2 mL    Refill:  11  . norgestimate-ethinyl estradiol (SPRINTEC 28) 0.25-35 MG-MCG tablet    Sig: Take 1 tablet by mouth daily.    Dispense:  84 tablet    Refill:  3    No Follow-up on file.   Schyler Counsell MElayne Guerin M.D. [facility]  [phone] [phone]

## 2016-10-30 NOTE — Patient Instructions (Signed)
     IF you received an x-ray today, you will receive an invoice from [facility]. Please contact [facility] at [phone] with questions or concerns regarding your invoice.   IF you received labwork today, you will receive an invoice from [facility]. Please contact [facility] at [phone] with questions or concerns regarding your invoice.   Our billing staff will not be able to assist you with questions regarding bills from these companies.  You will be contacted with the lab results as soon as they are available. The fastest way to get your results is to activate your My Chart account. Instructions are located on the last page of this paperwork. If you have not heard from us regarding the results in 2 weeks, please contact this office.     

## 2016-10-31 LAB — CBC WITH DIFFERENTIAL/PLATELET
BASOS: 0 %
Basophils Absolute: 0 10*3/uL (ref 0.0–0.2)
EOS (ABSOLUTE): 0.2 10*3/uL (ref 0.0–0.4)
EOS: 2 %
HEMATOCRIT: 39 % (ref 34.0–46.6)
HEMOGLOBIN: 13.7 g/dL (ref 11.1–15.9)
Immature Grans (Abs): 0 10*3/uL (ref 0.0–0.1)
Immature Granulocytes: 0 %
LYMPHS ABS: 2.8 10*3/uL (ref 0.7–3.1)
LYMPHS: 30 %
MCH: 29.3 pg (ref 26.6–33.0)
MCHC: 35.1 g/dL (ref 31.5–35.7)
MCV: 84 fL (ref 79–97)
MONOCYTES: 5 %
Monocytes Absolute: 0.5 10*3/uL (ref 0.1–0.9)
NEUTROS ABS: 5.8 10*3/uL (ref 1.4–7.0)
Neutrophils: 63 %
Platelets: 305 10*3/uL (ref 150–379)
RBC: 4.67 x10E6/uL (ref 3.77–5.28)
RDW: 13.7 % (ref 12.3–15.4)
WBC: 9.2 10*3/uL (ref 3.4–10.8)

## 2016-10-31 LAB — COMPREHENSIVE METABOLIC PANEL
A/G RATIO: 1.7 (ref 1.2–2.2)
ALBUMIN: 4.6 g/dL (ref 3.5–5.5)
ALT: 19 IU/L (ref 0–32)
AST: 19 IU/L (ref 0–40)
Alkaline Phosphatase: 44 IU/L (ref 39–117)
BILIRUBIN TOTAL: 0.6 mg/dL (ref 0.0–1.2)
BUN / CREAT RATIO: 16 (ref 9–23)
BUN: 10 mg/dL (ref 6–20)
CHLORIDE: 103 mmol/L (ref 96–106)
CO2: 21 mmol/L (ref 20–29)
Calcium: 9.4 mg/dL (ref 8.7–10.2)
Creatinine, Ser: 0.61 mg/dL (ref 0.57–1.00)
GFR calc non Af Amer: 126 mL/min/{1.73_m2} (ref >59)
GFR, EST AFRICAN AMERICAN: 146 mL/min/{1.73_m2} (ref >59)
GLOBULIN, TOTAL: 2.7 g/dL (ref 1.5–4.5)
GLUCOSE: 96 mg/dL (ref 65–99)
Potassium: 4.1 mmol/L (ref 3.5–5.2)
SODIUM: 140 mmol/L (ref 134–144)
Total Protein: 7.3 g/dL (ref 6.0–8.5)

## 2016-10-31 LAB — LIPID PANEL
CHOLESTEROL TOTAL: 147 mg/dL (ref 100–199)
Chol/HDL Ratio: 2 ratio (ref 0.0–4.4)
HDL: 75 mg/dL (ref >39)
LDL CALC: 47 mg/dL (ref 0–99)
Triglycerides: 126 mg/dL (ref 0–149)
VLDL CHOLESTEROL CAL: 25 mg/dL (ref 5–40)

## 2016-10-31 LAB — HEMOGLOBIN A1C
Est. average glucose Bld gHb Est-mCnc: 94 mg/dL
Hgb A1c MFr Bld: 4.9 % (ref 4.8–5.6)

## 2016-10-31 LAB — TSH: TSH: 2.03 u[IU]/mL (ref 0.450–4.500)

## 2016-10-31 LAB — HIV ANTIBODY (ROUTINE TESTING W REFLEX): HIV SCREEN 4TH GENERATION: NONREACTIVE

## 2016-10-31 LAB — GC/CHLAMYDIA PROBE AMP
CHLAMYDIA, DNA PROBE: NEGATIVE
NEISSERIA GONORRHOEAE BY PCR: NEGATIVE

## 2016-10-31 LAB — RPR: RPR Ser Ql: NONREACTIVE

## 2016-11-02 ENCOUNTER — Encounter: Payer: Self-pay | Admitting: Family Medicine

## 2016-12-24 ENCOUNTER — Ambulatory Visit: Payer: BC Managed Care – PPO | Admitting: Physician Assistant

## 2016-12-25 ENCOUNTER — Encounter: Payer: Self-pay | Admitting: Physician Assistant

## 2016-12-25 ENCOUNTER — Other Ambulatory Visit: Payer: Self-pay

## 2016-12-25 ENCOUNTER — Ambulatory Visit: Payer: BC Managed Care – PPO | Admitting: Physician Assistant

## 2016-12-25 VITALS — BP 110/80 | HR 90 | Temp 98.1°F | Resp 16 | Ht 66.0 in | Wt 216.6 lb

## 2016-12-25 DIAGNOSIS — E282 Polycystic ovarian syndrome: Secondary | ICD-10-CM | POA: Diagnosis not present

## 2016-12-25 DIAGNOSIS — Z3009 Encounter for other general counseling and advice on contraception: Secondary | ICD-10-CM

## 2016-12-25 DIAGNOSIS — F329 Major depressive disorder, single episode, unspecified: Secondary | ICD-10-CM

## 2016-12-25 DIAGNOSIS — F411 Generalized anxiety disorder: Secondary | ICD-10-CM | POA: Diagnosis not present

## 2016-12-25 DIAGNOSIS — R0683 Snoring: Secondary | ICD-10-CM

## 2016-12-25 DIAGNOSIS — F419 Anxiety disorder, unspecified: Secondary | ICD-10-CM

## 2016-12-25 DIAGNOSIS — F32A Depression, unspecified: Secondary | ICD-10-CM

## 2016-12-25 MED ORDER — FLUOXETINE HCL 40 MG PO CAPS: 80.0000 mg | ORAL_CAPSULE | Freq: Every day | ORAL | 3 refills | Status: DC

## 2016-12-25 MED ORDER — METFORMIN HCL ER 500 MG PO TB24: ORAL_TABLET | ORAL | 1 refills | Status: DC

## 2016-12-25 NOTE — Progress Notes (Signed)
Sherry HeadsKayla Mercado  MRN: [id number] DOB: 1990/05/17  PCP: Ethelda ChickSmith, Kristi M, MD  Subjective:  Pt is a 26 year old female PMH ADHD , anxiety and PCOS who presents to clinic for several complaints.   1) PCOS - She was taking Metformin but had diarrhea. She was started on Trulicity 9/12 and it "made my life miserable" - she was sick with "intense nausea all the time" with episodes of vomiting on a weekly basis. +fatigue. Last dose of Trulicity was a few weeks ago. She started missing classes due to this and now she is very stressed out.  Metformin immediate release - she was taking 2 or 3 daily instead of 4 daily. She never finished Metformin immediate release so she never started Metformin XR.    2) Mental health - Fluoxetine 60mg  qd. she would like a stronger dose. Recently she has broken down with uncontrollable crying. This is happening way more easily than before. She endorses feeling more suspicious of people and "thinks they hate my guts out".   3) Snoring - her boyfriend says she snores and stops breathing at night. She would like a sleep study.   4) She sometimes forgets to take her birth control pill.   Review of Systems  Gastrointestinal: Positive for abdominal pain, nausea and vomiting.  Psychiatric/Behavioral: Positive for dysphoric mood. Negative for self-injury and suicidal ideas. The patient is nervous/anxious.     Patient Active Problem List   Diagnosis Date Noted  . PCOS (polycystic ovarian syndrome) 05/24/2015  . Anxiety state 09/18/2006  . ADHD 06/12/2006    Current Outpatient Medications on File Prior to Visit  Medication Sig Dispense Refill  . Dulaglutide (TRULICITY) 0.75 MG/0.5ML SOPN Inject 0.75 mg into the skin once a week. 2 mL 11  . FLUoxetine HCl 60 MG TABS Take 60 mg by mouth daily. 30 tablet 5  . hydrOXYzine (ATARAX/VISTARIL) 25 MG tablet Take 0.5-1 tablets (12.5-25 mg total) by mouth every 8 (eight) hours as needed for anxiety. 30 tablet 1  .  norgestimate-ethinyl estradiol (SPRINTEC 28) 0.25-35 MG-MCG tablet Take 1 tablet by mouth daily. 84 tablet 3  . metFORMIN (GLUCOPHAGE-XR) 500 MG 24 hr tablet Take 4 tablets (2,000 mg total) by mouth daily with breakfast. (Patient not taking: Reported on 12/25/2016) 360 tablet 1   No current facility-administered medications on file prior to visit.     No Known Allergies   Objective:  BP 110/80   Pulse 90   Temp 98.1 F (36.7 C) (Oral)   Resp 16   Ht 5\' 6"  (1.676 m)   Wt 216 lb 9.6 oz (98.2 kg)   LMP 12/05/2016   SpO2 98%   BMI 34.96 kg/m   Physical Exam  Constitutional: She is oriented to person, place, and time and well-developed, well-nourished, and in no distress. No distress.  Cardiovascular: Normal rate, regular rhythm and normal heart sounds.  Neurological: She is alert and oriented to person, place, and time. GCS score is 15.  Skin: Skin is warm and dry.  Psychiatric: Memory, affect and judgment normal. Her mood appears anxious.  Vitals reviewed.   Assessment and Plan :  1. PCOS (polycystic ovarian syndrome) - metFORMIN (GLUCOPHAGE-XR) 500 MG 24 hr tablet; Take 4 tablets (2,000 mg total) by mouth daily with breakfast.  Dispense: 360 tablet; Refill: 1 - Pt endorses side effects from Trulicity which are "worse than Metformin". It seems she never started Metformin XR. Plan to start Metformin XR. Schedule discussed and printed out for  pt. RTC in 4-6 weeks to f/u with Dr. Smith.  2. Anxiety state  3. Snoring - Ambulatory referral to Sleep Studies  4. Anxiety and depression Katrinka Blazing5. Depression, unspecified depression type - FLUoxetine (PROZAC) 40 MG capsule; Take 2 capsules (80 mg total) daily by mouth.  Dispense: 60 capsule; Refill: 3 - Pt has experienced several "break downs" in the last several weeks which are affecting her school work. Plan to increase medication from 60mg  to 80mg  qd. F/u with Dr. Katrinka BlazingSmith in 4-6 weeks.  6. Encounter for other general counseling or advice on  contraception - She doesn't remember to take her OCPs daily and is interested in new method in which she does not have to remember to take medication. Discussed Mirena IUD. She is interested and would like to discuss this with Dr. Katrinka BlazingSmith at f/u visit in 4 weeks.    Marco CollieWhitney Aleynah Rocchio, PA-C  [facility] at [facility] 12/25/2016 10:44 AM

## 2016-12-25 NOTE — Patient Instructions (Addendum)
1) Be sure to schedule a follow-up appointment with Dr. Tamala Julian in 4-6 weeks. Talk to her about Mirena IUD birth control.   2) Start Metformin extended release (see below)  Metformin Dosing (to be taken with food) Week 1: take 1/2 tablet daily. Week 2: take 1 tablet in the morning. Week 3: take 2 tablets in the morning Week 4: take 3 tablets in the morning  Week 5: take 4 tablets in the morning.   3) You will receive a phone call to schedule an appointment for a sleep study.    Thank you for coming in today. I hope you feel we met your needs.  Feel free to call PCP if you have any questions or further requests.  Please consider signing up for MyChart if you do not already have it, as this is a great way to communicate with me.  Best,  Whitney McVey, PA-C   IF you received an x-ray today, you will receive an invoice from [facility]. Please contact [facility] at [phone] with questions or concerns regarding your invoice.   IF you received labwork today, you will receive an invoice from [facility]. Please contact [facility] at [phone] with questions or concerns regarding your invoice.   Our billing staff will not be able to assist you with questions regarding bills from these companies.  You will be contacted with the lab results as soon as they are available. The fastest way to get your results is to activate your My Chart account. Instructions are located on the last page of this paperwork. If you have not heard from [place] regarding the results in 2 weeks, please contact this office.

## 2017-01-22 ENCOUNTER — Other Ambulatory Visit: Payer: Self-pay | Admitting: Physician Assistant

## 2017-01-22 ENCOUNTER — Telehealth: Payer: Self-pay | Admitting: *Deleted

## 2017-01-22 NOTE — Telephone Encounter (Signed)
Per fax request phoned into [facility] pharmacy [phone], Fluoxetine HCL 40 mg capsule, (80 mg) take 2 capsules daily by mouth. Qty 180, no refills.

## 2017-01-29 ENCOUNTER — Ambulatory Visit: Payer: BC Managed Care – PPO | Admitting: Family Medicine

## 2017-02-25 ENCOUNTER — Ambulatory Visit: Payer: BC Managed Care – PPO | Admitting: Physician Assistant

## 2017-02-25 ENCOUNTER — Encounter: Payer: Self-pay | Admitting: Physician Assistant

## 2017-02-25 ENCOUNTER — Other Ambulatory Visit: Payer: Self-pay

## 2017-02-25 VITALS — BP 126/80 | HR 84 | Temp 98.2°F | Resp 18 | Ht 66.97 in | Wt 221.2 lb

## 2017-02-25 DIAGNOSIS — F329 Major depressive disorder, single episode, unspecified: Secondary | ICD-10-CM

## 2017-02-25 DIAGNOSIS — Z309 Encounter for contraceptive management, unspecified: Secondary | ICD-10-CM

## 2017-02-25 DIAGNOSIS — F32A Depression, unspecified: Secondary | ICD-10-CM

## 2017-02-25 LAB — POCT URINE PREGNANCY: PREG TEST UR: NEGATIVE

## 2017-02-25 MED ORDER — NORGESTIMATE-ETH ESTRADIOL 0.25-35 MG-MCG PO TABS: 1.0000 | ORAL_TABLET | Freq: Every day | ORAL | 1 refills | Status: DC

## 2017-02-25 NOTE — Patient Instructions (Addendum)
Please schedule an appointment with Dr. Leretha PolSantiago, Dr. Creta LevinStallings, or PA Hudsonhelle or Weber for nexplanon insertion. Schedule the insertion at a specific time of your menstrual cycle (for example, within the first 5 days of your regular menstrual bleeding).  I have given you a refill for Sprintec to use in the meantime.  In terms of depression and anxiety, I am glad we could schedule your appointment.  Please follow-up with them as planned.  If any of your symptoms worsen or you develop new suicidal thoughts, please seek care immediately.  IF you received an x-ray today, you will receive an invoice from [facility]. Please contact [facility] at [phone] with questions or concerns regarding your invoice.   IF you received labwork today, you will receive an invoice from [facility]. Please contact [facility] at [phone] with questions or concerns regarding your invoice.   Our billing staff will not be able to assist you with questions regarding bills from these companies.  You will be contacted with the lab results as soon as they are available. The fastest way to get your results is to activate your My Chart account. Instructions are located on the last page of this paperwork. If you have not heard from us regarding the results in 2 weeks, please contact this office.

## 2017-02-25 NOTE — Progress Notes (Signed)
Sherry HeadsKayla Hatton  MRN: [id number] DOB: August 20, 1990  Subjective:  Sherry Mercado is a 27 y.o. female with PMH of anxiety, depression, and PCOS seen in office today for a chief complaint of birth control counseling.  She has been on oral contraceptives for quite some time but wants an option she does not to think about.  She cannot remember to take Sprintec daily. Has talked with providers in the past about LARCs but has not followed up for this. LMP 02/15/17. She has been sexually active since last menses but has used condoms consistently. Has taken Sprintec sporadically since last cycle. She denies menorrhagia and dysmenorrhea. Pt does not smoke, has no personal history or family history of clotting disorders.  She is not sure if her insurance covers long-acting contraceptive methods.  Patient notes she tends to take her birth control more sporadically when her depression is worse.  Over the past few months, she has had more crying episodes, decreased energy, and decreased pleasure. Notes the prozac does help a lot but there is just a lot going on right now.  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  Thinks she would benefit from therapy and further evaluation form psychiatrist. She had her dad contact a local psychiatrist yesterday but they were out to lunch. Taking 80mg  prozac daily, will sometimes forget and it will make her life worse. She has never successfully gotten into therapy. Has both anxiety and depression. Cannot identify which is the leading factor. Will occasionally have suicidal thoughts, not currently suicidal. No suicidal plan.   Review of Systems  Constitutional: Negative for chills, diaphoresis and fever.  Gastrointestinal: Negative for abdominal pain, diarrhea and nausea.    Patient Active Problem List   Diagnosis Date Noted  . PCOS (polycystic ovarian syndrome) 05/24/2015  . Anxiety state 09/18/2006  . ADHD 06/12/2006    Current Outpatient  Medications on File Prior to Visit  Medication Sig Dispense Refill  . FLUoxetine (PROZAC) 40 MG capsule Take 2 capsules (80 mg total) daily by mouth. 60 capsule 3  . hydrOXYzine (ATARAX/VISTARIL) 25 MG tablet Take 0.5-1 tablets (12.5-25 mg total) by mouth every 8 (eight) hours as needed for anxiety. 30 tablet 1  . metFORMIN (GLUCOPHAGE-XR) 500 MG 24 hr tablet Take 4 tablets (2,000 mg total) by mouth daily with breakfast. 360 tablet 1  . Dulaglutide (TRULICITY) 0.75 MG/0.5ML SOPN Inject 0.75 mg into the skin once a week. (Patient not taking: Reported on 02/25/2017) 2 mL 11   No current facility-administered medications on file prior to visit.     No Known Allergies   Objective:  BP 126/80 (BP Location: Left Arm, Patient Position: Sitting, Cuff Size: Normal)   Pulse 84   Temp 98.2 F (36.8 C) (Oral)   Resp 18   Ht 5' 6.97" (1.701 m)   Wt 221 lb 3.2 oz (100.3 kg)   LMP 02/15/2017 (Approximate)   SpO2 99%   BMI 34.68 kg/m   Physical Exam  Constitutional: She is oriented to person, place, and time and well-developed, well-nourished, and in no distress.  HENT:  Head: Normocephalic and atraumatic.  Eyes: Conjunctivae are normal.  Neck: Normal range of motion.  Pulmonary/Chest: Effort normal.  Neurological: She is alert and oriented to person, place, and time. Gait normal.  Skin: Skin is warm and dry.  Psychiatric: Affect normal.  Vitals reviewed.    [facility] 02/25/2017 12/25/2016 10/30/2016 03/21/2016 01/31/2016  Decreased Interest 3 0 0  0 0  Down, Depressed, Hopeless 3 - 0 0 0  PHQ - 2 Score 6 0 0 0 0  Altered sleeping 3 - - - -  Tired, decreased energy 3 - - - -  Change in appetite 3 - - - -  Feeling bad or failure about yourself  3 - - - -  Trouble concentrating 0 - - - -  Moving slowly or fidgety/restless 0 - - - -  Suicidal thoughts 2 - - - -  PHQ-9 Score 20 - - - -  Difficult doing work/chores Extremely dIfficult - - - -   GAD 7 : Generalized Anxiety Score  02/25/2017 10/30/2016 01/05/2016  Nervous, Anxious, on Edge 2 1 3   Control/stop worrying 3 0 1  Worry too much - different things 3 0 1  Trouble relaxing 1 0 0  Restless 0 0 1  Easily annoyed or irritable 2 0 0  Afraid - awful might happen 3 0 0  Total GAD 7 Score 14 1 6   Anxiety Difficulty Not difficult at all Somewhat difficult Somewhat difficult   Results for orders placed or performed in visit on 02/25/17 (from the past 24 hour(s))  POCT urine pregnancy     Status: Normal   Collection Time: 02/25/17 10:26 AM  Result Value Ref Range   Preg Test, Ur Negative Negative   Assessment and Plan :  1. Encounter for contraceptive management, unspecified type Our office contacted patient's insurance company and they do approve both Nexplanon and IUD.  Patient would prefer Nexplanon insertion.  Pregnancy test today negative.  She will have to schedule an appointment with a provider at her clinic that inserts. Recommended she return for insertion within 5 days of starting her next menstrual cycle.  She is scheduled appointment with Dr. Leretha Pol on 03/11/17. - POCT urine pregnancy - norgestimate-ethinyl estradiol (SPRINTEC 28) 0.25-35 MG-MCG tablet; Take 1 tablet by mouth daily.  Dispense: 30 tablet; Refill: 1  2. Depression, unspecified depression type It is likely that patient would benefit from psychiatry.  After further discussion, it was discovered that the reason she has not contacted psychiatry consistently is because she is afraid of making phone calls.  CMA Raynelle Fanning contacted the psychiatrist she is interested in seeing and scheduled an appointment for 03/09/17.  Patient thanked [place] tremendously for taking time to schedule this appointment for her.  Plan to follow-up with psychiatry as discussed.  Continue with current medication regimen.  While awaiting appointment, if any of her symptoms worsen or she develops new suicidal thoughts or plans, educated to seek care immediately. - Ambulatory referral  to Psychiatry  A total of 25 minutes was spent in the room with the patient, greater than 50% of which was in counseling/coordination of care regarding contraceptive management, depression, and anxiety.   Benjiman Core PA-C  Primary Care at [facility] 02/25/2017 1:12 PM

## 2017-03-11 ENCOUNTER — Ambulatory Visit: Payer: BC Managed Care – PPO | Admitting: Family Medicine

## 2017-03-11 ENCOUNTER — Encounter: Payer: Self-pay | Admitting: Family Medicine

## 2017-03-11 ENCOUNTER — Other Ambulatory Visit: Payer: Self-pay

## 2017-03-11 VITALS — BP 108/82 | HR 93 | Temp 98.5°F | Ht 67.0 in | Wt 226.8 lb

## 2017-03-11 DIAGNOSIS — Z30017 Encounter for initial prescription of implantable subdermal contraceptive: Secondary | ICD-10-CM | POA: Diagnosis not present

## 2017-03-11 LAB — POCT URINE PREGNANCY: Preg Test, Ur: NEGATIVE

## 2017-03-11 MED ORDER — ETONOGESTREL 68 MG ~~LOC~~ IMPL: 68.0000 mg | DRUG_IMPLANT | Freq: Once | SUBCUTANEOUS | Status: AC

## 2017-03-11 NOTE — Patient Instructions (Signed)
     IF you received an x-ray today, you will receive an invoice from [facility]. Please contact [facility] at [phone] with questions or concerns regarding your invoice.   IF you received labwork today, you will receive an invoice from [facility]. Please contact [facility] at [phone] with questions or concerns regarding your invoice.   Our billing staff will not be able to assist you with questions regarding bills from these companies.  You will be contacted with the lab results as soon as they are available. The fastest way to get your results is to activate your My Chart account. Instructions are located on the last page of this paperwork. If you have not heard from us regarding the results in 2 weeks, please contact this office.     

## 2017-03-11 NOTE — Progress Notes (Signed)
1/22/20194:23 PM  Sherry Mercado [birth year], 27 y.o. female [id number]  Chief Complaint  Patient presents with  . Contraception    initial visit for contraception management    HPI:   Patient is a 27 y.o. female who presents today for nexplanon insertion. Had original consult with Benjiman CoreBrittany Wiseman on 02/25/17.  G0 LMP 03/06/2017 Patient currently on OCPs Known PCOS and depression  Dep[facility] screen [facility] 02/25/2017 12/25/2016 10/30/2016  Decreased Interest 3 0 0  Down, Depressed, Hopeless 3 - 0  PHQ - 2 Score 6 0 0  Altered sleeping 3 - -  Tired, decreased energy 3 - -  Change in appetite 3 - -  Feeling bad or failure about yourself  3 - -  Trouble concentrating 0 - -  Moving slowly or fidgety/restless 0 - -  Suicidal thoughts 2 - -  PHQ-9 Score 20 - -  Difficult doing work/chores Extremely dIfficult - -    No Known Allergies  Prior to Admission medications   Medication Sig Start Date End Date Taking? Authorizing Provider  FLUoxetine (PROZAC) 40 MG capsule Take 2 capsules (80 mg total) daily by mouth. 12/25/16  Yes McVey, Madelaine BhatElizabeth Whitney, PA-C  hydrOXYzine (ATARAX/VISTARIL) 25 MG tablet Take 0.5-1 tablets (12.5-25 mg total) by mouth every 8 (eight) hours as needed for anxiety. 01/05/16  Yes Ethelda ChickSmith, Kristi M, MD  metFORMIN (GLUCOPHAGE-XR) 500 MG 24 hr tablet Take 4 tablets (2,000 mg total) by mouth daily with breakfast. 12/25/16  Yes McVey, Madelaine BhatElizabeth Whitney, PA-C  norgestimate-ethinyl estradiol (SPRINTEC 28) 0.25-35 MG-MCG tablet Take 1 tablet by mouth daily. 02/25/17  Yes Magdalene RiverWiseman, Brittany D, PA-C    Past Medical History:  Diagnosis Date  . Allergy    Zyrtec PRN  . Anxiety   . Depression     Past Surgical History:  Procedure Laterality Date  . EYE SURGERY     tear duct procedure    Social History   Tobacco Use  . Smoking status: Never Smoker  . Smokeless tobacco: Never Used  Substance Use Topics  . Alcohol use: Yes    Alcohol/week: 0.0 oz    Comment:  social    Family History  Problem Relation Age of Onset  . Hearing loss Mother   . Bipolar disorder Mother   . Depression Mother   . Anxiety disorder Mother   . Bipolar disorder Brother   . Anxiety disorder Brother   . Cancer Maternal Grandmother        breast cancer  . Bipolar disorder Maternal Grandmother   . Depression Maternal Grandmother   . Hearing loss Maternal Grandfather   . Diabetes Father   . Anxiety disorder Father   . Deep vein thrombosis Father        After immobilization from surgery    ROS Per hpi  OBJECTIVE:  Blood pressure 108/82, pulse 93, temperature 98.5 F (36.9 C), temperature source Oral, height 5\' 7"  (1.702 m), weight 226 lb 12.8 oz (102.9 kg), last menstrual period 03/06/2017, SpO2 99 %.  Physical Exam   Gen: AAOx3, NAD  Results for orders placed or performed in visit on 03/11/17 (from the past 24 hour(s))  POCT urine pregnancy     Status: None   Collection Time: 03/11/17 11:37 AM  Result Value Ref Range   Preg Test, Ur Negative Negative   Procedure Note:  Nexplanon r/se/b discussed. All questions answered. Verbal consent obtained. The patient was placed in a supine position and the LEFT arm was marked with  a pen at the insertion site 6-8 cm above the medical epicondyle in the groove between the triceps and biceps. The insertion site was cleaned with betadine. 2 mLs of  2% lidocaine with epinephrine was place along the insertion track. The skin was stretched and the wide bore needle on the inserter entered the skin at a 20 degree angle. The inserter was lowered along the plane of the skin and then lifted and tented as the needle was inserted to its full length. The device was deployed and removed. Placement of the rod was confirmed by palpation. A small adhesive bandage and a pressure dressing was placed over the insertion site. The patient tolerated the procedure well.    ASSESSMENT and PLAN  1. Nexplanon insertion Routine after insertion  care instructions and precautions reviewed. Removal in 3 years or sooner if desired. Stop OCPs in 4 days.  - POCT urine pregnancy - etonogestrel (NEXPLANON) implant 68 mg inserted today, LEFT arm, lot # [id number]  Return in about 4 weeks (around 04/08/2017).    Myles Lipps, MD Primary Care at [facility] [address] [place], [place] [address] Ph.  [phone] Fax [phone]

## 2017-03-19 ENCOUNTER — Encounter: Payer: Self-pay | Admitting: Family Medicine

## 2017-04-09 ENCOUNTER — Encounter (HOSPITAL_COMMUNITY): Payer: Self-pay | Admitting: Psychiatry

## 2017-04-09 ENCOUNTER — Ambulatory Visit (HOSPITAL_COMMUNITY): Payer: BC Managed Care – PPO | Admitting: Psychiatry

## 2017-04-09 ENCOUNTER — Telehealth (HOSPITAL_COMMUNITY): Payer: Self-pay | Admitting: Psychiatry

## 2017-04-09 VITALS — BP 122/74 | HR 78 | Ht 65.0 in | Wt 225.0 lb

## 2017-04-09 DIAGNOSIS — Z818 Family history of other mental and behavioral disorders: Secondary | ICD-10-CM | POA: Diagnosis not present

## 2017-04-09 DIAGNOSIS — F332 Major depressive disorder, recurrent severe without psychotic features: Secondary | ICD-10-CM

## 2017-04-09 DIAGNOSIS — R51 Headache: Secondary | ICD-10-CM | POA: Diagnosis not present

## 2017-04-09 DIAGNOSIS — Z6379 Other stressful life events affecting family and household: Secondary | ICD-10-CM

## 2017-04-09 DIAGNOSIS — G4700 Insomnia, unspecified: Secondary | ICD-10-CM | POA: Diagnosis not present

## 2017-04-09 DIAGNOSIS — E282 Polycystic ovarian syndrome: Secondary | ICD-10-CM

## 2017-04-09 DIAGNOSIS — F411 Generalized anxiety disorder: Secondary | ICD-10-CM | POA: Diagnosis not present

## 2017-04-09 DIAGNOSIS — F4010 Social phobia, unspecified: Secondary | ICD-10-CM | POA: Diagnosis not present

## 2017-04-09 DIAGNOSIS — R450 Nervousness: Secondary | ICD-10-CM | POA: Diagnosis not present

## 2017-04-09 DIAGNOSIS — F1210 Cannabis abuse, uncomplicated: Secondary | ICD-10-CM | POA: Diagnosis not present

## 2017-04-09 DIAGNOSIS — F410 Panic disorder [episodic paroxysmal anxiety] without agoraphobia: Secondary | ICD-10-CM

## 2017-04-09 MED ORDER — VENLAFAXINE HCL ER 37.5 MG PO CP24: ORAL_CAPSULE | ORAL | 0 refills | Status: DC

## 2017-04-09 MED ORDER — HYDROXYZINE HCL 10 MG PO TABS: ORAL_TABLET | ORAL | 0 refills | Status: DC

## 2017-04-09 NOTE — Progress Notes (Addendum)
Psychiatric Initial Adult Assessment   Patient Identification: Sherry Mercado MRN:  [id number] Date of Evaluation:  04/09/2017 Referral Source: Primary care physician. Chief Complaint:  I am very nervous and anxious.  Visit Diagnosis:    ICD-10-CM   1. GAD (generalized anxiety disorder) F41.1 venlafaxine XR (EFFEXOR XR) 37.5 MG 24 hr capsule    hydrOXYzine (ATARAX/VISTARIL) 10 MG tablet    History of Present Illness: Sherry Mercado is 27 year old Caucasian, part-time employed Archivist who is referred from primary care physician for the management of her anxiety symptoms.  Patient reported that she struggled with anxiety and depression most of her life however since started the school in 2017 her depression and anxiety got worse.  She is having crying spells, extreme nervousness, social isolation, lack of interest and lack of motivation to do things.  She is failing rates in the school and she decided to take a semester off because she could not function.  Patient has major in [place] and voice but lately she is being so nervous that she is embarrassing herself in rehearsals.  Her anxiety is so bad that she does not leave her home and sometime her room.  She admitted having crying spells, racing thoughts, panic attacks.  She has difficulty interacting with peers and she feels very embarrassed and hopeless less.  She has a lot of negative thinking.  She worried about her future, health, and finances.  She reported decreased energy and that worries her a lot.  She is afraid that she cannot live independently.  She does not leave the house unless her father or boyfriend is with her.  Sometimes she feels that her memory is not good because she tends to forget things.  She has trouble thinking and sometimes difficulty organizing her life.  She admitted history of self abusing by cutting herself but she has not done lately.  Her other stressors are recently parents separated a year ago.  She is upset on her  mother who chooses to leave independently with her boyfriend.  Patient does not like her mother's boyfriend she like to spend more time with mother with due to her mother's relationship she cannot do it.  She is very close to her father.  The patient admitted anhedonia and hopelessness but denies any active suicidal thoughts or homicidal thought.  She denies any mania, psychosis, hallucination or any paranoia.  She is afraid to leave the house by herself.  She goes to grocery store with her father or the boyfriend.  She does not enjoy ballgame or any crowded places where there are a lot of people around.  She has cut down driving because she cannot drive by herself.  She endorsed panic attacks which usually last a few minutes to half an hour and crowded place.  Her other concern is her general health.  She has weight gain and diagnosed with polycystic ovarian disorder.  She feel generalized weakness, easily tired with lack of energy and motivation.  Her primary care physician recently increased her Prozac from 60 mg to 80 mg in December.  She admitted there are times when she is poorly compliant with Prozac but she has not seen any improvement overall.  She is also prescribed hydroxyzine 25 mg but she really takes because it makes her sleepy.  However she admitted it helps the anxiety and panic attacks.  Patient admitted occasional drinking alcohol but denies any binge, intoxication or any tremors or withdrawal.  She admitted smoking marijuana on a regular basis  to calm her down.  Patient denies any aggressive behavior, anger issues, OCD or any PTSD symptoms.   Associated Signs/Symptoms: Depression Symptoms:  depressed mood, anhedonia, insomnia, fatigue, feelings of worthlessness/guilt, difficulty concentrating, hopelessness, anxiety, panic attacks, loss of energy/fatigue, disturbed sleep, weight gain, (Hypo) Manic Symptoms:  Distractibility, Anxiety Symptoms:  Excessive Worry, Panic  Symptoms, Social Anxiety, Psychotic Symptoms:  No psychotic symptoms. PTSD Symptoms: Negative  Past Psychiatric History:  Patient remember diagnosed ADHD in her school age.  She had tried stimulants and remembering taking Ritalin but that makes her anxiety worse.  She stopped taking ADHD medication in high school and her primary care physician switched to antidepressant to help with anxiety.  She tried Celexa for a while but stopped working and switched to Prozac up to 80 mg.  Patient denies any history of suicidal attempt or any psychiatric inpatient treatment.  Patient reported history of severe anxiety, depression, self abusive behavior.  In 2017 she superficially cut her leg and torso broke up with the boyfriend.    Previous Psychotropic Medications: Yes   Substance Abuse History in the last 12 months:  Yes.    Consequences of Substance Abuse: Negative  Past Medical History:  Past Medical History:  Diagnosis Date  . Allergy    Zyrtec PRN  . Anxiety   . Depression     Past Surgical History:  Procedure Laterality Date  . EYE SURGERY     tear duct procedure    Family Psychiatric History: Patient has multiple family member who has mental disorder.    Family History:  Family History  Problem Relation Age of Onset  . Hearing loss Mother   . Bipolar disorder Mother   . Depression Mother   . Anxiety disorder Mother   . Bipolar disorder Brother   . Anxiety disorder Brother   . Cancer Maternal Grandmother        breast cancer  . Bipolar disorder Maternal Grandmother   . Depression Maternal Grandmother   . Hearing loss Maternal Grandfather   . Diabetes Father   . Anxiety disorder Father   . Deep vein thrombosis Father        After immobilization from surgery    Social History:   Social History   Socioeconomic History  . Marital status: Single    Spouse name: n/a  . Number of children: 0  . Years of education: college  . Highest education level: Not on file   Social Needs  . Financial resource strain: Not on file  . Food insecurity - worry: Not on file  . Food insecurity - inability: Not on file  . Transportation needs - medical: Not on file  . Transportation needs - non-medical: Not on file  Occupational History  . Occupation: singer  Tobacco Use  . Smoking status: Never Smoker  . Smokeless tobacco: Never Used  Substance and Sexual Activity  . Alcohol use: Yes    Alcohol/week: 0.0 oz    Comment: social  . Drug use: No  . Sexual activity: Yes    Partners: Male    Birth control/protection: Condom  Other Topics Concern  . Not on file  Social History Narrative   Marital status: single; not dating seriously.     Children: none      Lives: with dad in 2018; parents separated in 2018.  Brothers go back and forth (23, 21, 19).      Employment:  Consulting civil engineer      Education: [employer] 01/2015; voice  music major.       Tobacco: none      Alcohol: socially      Drugs: none      Sexual activity: dates females and males.    Additional Social History: Born in [place] and raised in [place]  Patient never married.  She has previous relationship in the past.  Her parents are separated year ago.  Patient is very close to her father.  Currently patient is in a relationship which is 8410 months old and reported her boyfriend is very supportive.  Patient is working part-time at her [employer] church.  Lives with her father, her brother and her boyfriend.  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    Allergies:  No Known Allergies  Metabolic Disorder Labs: Recent Results (from the past 2160 hour(s))  POCT urine pregnancy     Status: Normal   Collection Time: 02/25/17 10:26 AM  Result Value Ref Range   Preg Test, Ur Negative Negative  POCT urine pregnancy     Status: None   Collection Time: 03/11/17 11:37 AM  Result Value Ref Range   Preg Test, Ur Negative Negative   Lab Results  Component  Value Date   HGBA1C 4.9 10/30/2016   MPG 88 01/05/2016   MPG 108 03/28/2014   Lab Results  Component Value Date   PROLACTIN 7.8 03/28/2014   PROLACTIN 8.8 02/24/2012   Lab Results  Component Value Date   CHOL 147 10/30/2016   TRIG 126 10/30/2016   HDL 75 10/30/2016   CHOLHDL 2.0 10/30/2016   VLDL 16.122.0 08/29/2014   LDLCALC 47 10/30/2016   LDLCALC 62 08/29/2014     Current Medications: Current Outpatient Medications  Medication Sig Dispense Refill  . Dulaglutide (TRULICITY) 0.75 MG/0.5ML SOPN Inject 0.75 mg into the skin once a week. (Patient not taking: Reported on 02/25/2017) 2 mL 11  . FLUoxetine (PROZAC) 40 MG capsule Take 2 capsules (80 mg total) daily by mouth. 60 capsule 3  . hydrOXYzine (ATARAX/VISTARIL) 25 MG tablet Take 0.5-1 tablets (12.5-25 mg total) by mouth every 8 (eight) hours as needed for anxiety. 30 tablet 1  . metFORMIN (GLUCOPHAGE-XR) 500 MG 24 hr tablet Take 4 tablets (2,000 mg total) by mouth daily with breakfast. 360 tablet 1   Current Facility-Administered Medications  Medication Dose Route Frequency Provider Last Rate Last Dose  . etonogestrel (NEXPLANON) implant 68 mg  68 mg Subdermal Once Myles LippsSantiago, Irma M, MD        Neurologic: Headache: Yes Seizure: No Paresthesias:Negative  Musculoskeletal: Strength & Muscle Tone: within normal limits Gait & Station: normal Patient leans: N/A  Psychiatric Specialty Exam: Review of Systems  Constitutional: Positive for malaise/fatigue.  HENT: Negative.   Respiratory: Negative.   Skin: Negative.   Neurological: Positive for headaches.  Psychiatric/Behavioral: Positive for depression and substance abuse. The patient is nervous/anxious and has insomnia.     Blood pressure 122/74, pulse 78, height 5\' 5"  (1.651 m), weight 225 lb (102.1 kg).There is no height or weight on file to calculate BMI.  General Appearance: Casual and Obese and emotional  Eye Contact:  Fair  Speech:  Clear and Coherent  Volume:   Decreased  Mood:  Anxious, Depressed and Dysphoric  Affect:  Constricted and Depressed  Thought Process:  Goal Directed  Orientation:  Full (Time, Place, and Person)  Thought Content:  Rumination  Suicidal Thoughts:  No  Homicidal Thoughts:  No  Memory:  Immediate;   Good Recent;   Good Remote;   Good  Judgement:  Good  Insight:  Good  Psychomotor Activity:  Decreased and Restlessness  Concentration:  Concentration: Fair and Attention Span: Fair  Recall:  Good  Fund of Knowledge:Good  Language: Good  Akathisia:  No  Handed:  Right  AIMS (if indicated):  0  Assets:  Communication Skills Desire for Improvement Housing Resilience Social Support Talents/Skills  ADL's:  Intact  Cognition: WNL  Sleep:  fair   Assessment: Generalized anxiety disorder, major depressive disorder, recurrent.  Cannabis abuse.  Plan: I review her symptoms, history, current medication, psychosocial stressors and collect information from other providers.  We discussed in detail about her contributing factors.  Currently patient is taking Prozac 40 mg but does not feel it is working.  I recommended to stop the Prozac after reducing to 40 mg for 1 week.  We will try Effexor 37.5 mg for 1 week and then 75 mg daily.  I also recommended to reduce the hydroxyzine 10 mg since 25 mg causing excessive sleep to help with anxiety and panic attacks.  I also discussed about her cannabis abuse.  Discussed interaction with drug use with psychotropic medication.  We will do psychological testing and referred to psychologist for the appointment.  We also talked about intensive outpatient program, we will provide information about the program and if patient agreed she can start the program in this office.  We discussed medication side effects especially Effexor withdrawal and efficacy.  If patient did not decided intensive outpatient program she should see individual therapist for CBT.  Safety concern that if patient has suicidal  thoughts or homicidal thought then she need to call 911 or go to local emergency room.  We discussed her para suicidal behavior as patient has a history of cutting herself.  Patient is not engage recently however we discussed if symptoms appear then she should call us immediately.  Follow-up in 3-4 weeks.  Cleotis Nipper, MD 2/20/20199:12 AM

## 2017-04-09 NOTE — Telephone Encounter (Signed)
D:  Dr. Lolly MustacheArfeen referred pt to [facility].  A:  Placed call to pt to orient pt to [facility].  Pt requested Clinical research associatewriter to email information about the program.  Writer informed her that she would mail her an orientation folder with the information inside.  Inquired if pt wanted to go ahead and sign up for group; pt declined.  Encouraged pt to call writer if she would like to sign up at a later date.  R:  Pt receptive.   Jeri Modenaita Clark, M.Ed,CNA

## 2017-05-09 ENCOUNTER — Ambulatory Visit (HOSPITAL_COMMUNITY): Payer: BC Managed Care – PPO | Admitting: Psychiatry

## 2017-05-19 ENCOUNTER — Telehealth (HOSPITAL_COMMUNITY): Payer: Self-pay

## 2017-05-19 DIAGNOSIS — F411 Generalized anxiety disorder: Secondary | ICD-10-CM

## 2017-05-19 HISTORY — PX: WISDOM TOOTH EXTRACTION: SHX21

## 2017-05-19 NOTE — Telephone Encounter (Signed)
Patient needs a refill on Effexor, I denied it this morning because patient no showed her appointment last month, they have rescheduled to tomorrow, but she is out of medication. Is it okay to sent in 1 month? Please review and advise, thank you

## 2017-05-20 ENCOUNTER — Encounter (HOSPITAL_COMMUNITY): Payer: Self-pay | Admitting: Psychiatry

## 2017-05-20 ENCOUNTER — Ambulatory Visit (INDEPENDENT_AMBULATORY_CARE_PROVIDER_SITE_OTHER): Payer: BC Managed Care – PPO | Admitting: Psychiatry

## 2017-05-20 DIAGNOSIS — F411 Generalized anxiety disorder: Secondary | ICD-10-CM | POA: Diagnosis not present

## 2017-05-20 DIAGNOSIS — F332 Major depressive disorder, recurrent severe without psychotic features: Secondary | ICD-10-CM

## 2017-05-20 DIAGNOSIS — F410 Panic disorder [episodic paroxysmal anxiety] without agoraphobia: Secondary | ICD-10-CM | POA: Diagnosis not present

## 2017-05-20 DIAGNOSIS — R450 Nervousness: Secondary | ICD-10-CM

## 2017-05-20 DIAGNOSIS — Z818 Family history of other mental and behavioral disorders: Secondary | ICD-10-CM | POA: Diagnosis not present

## 2017-05-20 DIAGNOSIS — F1290 Cannabis use, unspecified, uncomplicated: Secondary | ICD-10-CM | POA: Diagnosis not present

## 2017-05-20 MED ORDER — VENLAFAXINE HCL ER 75 MG PO CP24: 75.0000 mg | ORAL_CAPSULE | Freq: Every day | ORAL | 1 refills | Status: DC

## 2017-05-20 MED ORDER — VENLAFAXINE HCL ER 37.5 MG PO CP24: ORAL_CAPSULE | ORAL | 0 refills | Status: DC

## 2017-05-20 MED ORDER — HYDROXYZINE HCL 10 MG PO TABS: ORAL_TABLET | ORAL | 0 refills | Status: DC

## 2017-05-20 NOTE — Progress Notes (Signed)
BH MD/PA/NP OP Progress Note  05/20/2017 4:21 PM Sherry Mercado  MRN:  [id number]  Chief Complaint: I like new medication.  It is helping my anxiety.  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  She was referred from primary care physician for anxiety symptoms.  Patient struggled with anxiety and depression most of her life.  She was taking Prozac but she was not feeling well.  She has lack of interest, lack of motivation, very anxious and nervous about feeling grades in school.  Patient has a major in [place] and she was very embarrassing herself in rehearsals.  We started her on Effexor.  She also taking Vistaril but it was making her very sleepy and we recommended to reduce the dose.  She is taking 75 mg Effexor and feeling better.  Last week she is out of the medication and started to have depressed again.  Her energy level is better.  We recommended intensive outpatient program but she did not want to do group therapy.  She did not schedule for psychological testing.  She feels the medicine working and she does not feel she need testing.  She is hoping [place].  She is excited about it.  Patient is still feel some time very anxious and does not leave the house unless her father or boyfriend is with her.  Patient denies any agitation, anger, mania, psychosis.  She is not involved in a recent cutting or self abusive behavior.  She cut down her drinking and she has not smoked marijuana since the last visit.  She is using Vistaril 10 mg for severe panic attacks.  She has no tremors, shakes or any EPS.  Her sleep is better.  Visit Diagnosis:    ICD-10-CM   1. GAD (generalized anxiety disorder) F41.1 venlafaxine XR (EFFEXOR-XR) 75 MG 24 hr capsule    hydrOXYzine (ATARAX/VISTARIL) 10 MG tablet    Past Psychiatric History: Reviewed. Patient diagnosed with ADHD in her school age.  She tried stimulant  but it make her more nervous and anxious.  She took Celexa for a while for help her anxiety but stopped working and switched to Prozac up to 80 mg with poor outcome.  Patient has history of depression, self abusive behavior.  In 2017 she superficially cut her leg and torso after broke up with the boyfriend.  Patient denies any psychiatric inpatient treatment or any suicidal attempt.  Past Medical History:  Past Medical History:  Diagnosis Date  . Allergy    Zyrtec PRN  . Anxiety   . Depression     Past Surgical History:  Procedure Laterality Date  . EYE SURGERY     tear duct procedure    Family Psychiatric History: Reviewed.  Family History:  Family History  Problem Relation Age of Onset  . Hearing loss Mother   . Bipolar disorder Mother   . Depression Mother   . Anxiety disorder Mother   . Bipolar disorder Brother   . Anxiety disorder Brother   . Cancer Maternal Grandmother        breast cancer  . Bipolar disorder Maternal Grandmother   . Depression Maternal Grandmother   . Hearing loss Maternal Grandfather   . Diabetes Father   . Anxiety disorder Father   . Deep vein thrombosis Father        After immobilization from surgery    Social History:  Social History  Socioeconomic History  . Marital status: Single    Spouse name: n/a  . Number of children: 0  . Years of education: college  . Highest education level: Not on file  Occupational History  . Occupation: singer  Social Needs  . Financial resource strain: Not on file  . Food insecurity:    Worry: Not on file    Inability: Not on file  . Transportation needs:    Medical: Not on file    Non-medical: Not on file  Tobacco Use  . Smoking status: Never Smoker  . Smokeless tobacco: Never Used  Substance and Sexual Activity  . Alcohol use: Yes    Alcohol/week: 0.0 oz    Comment: social  . Drug use: Yes    Types: Marijuana  . Sexual activity: Yes    Partners: Male    Birth control/protection: Condom   Lifestyle  . Physical activity:    Days per week: Not on file    Minutes per session: Not on file  . Stress: Not on file  Relationships  . Social connections:    Talks on phone: Not on file    Gets together: Not on file    Attends religious service: Not on file    Active member of club or organization: Not on file    Attends meetings of clubs or organizations: Not on file    Relationship status: Not on file  Other Topics Concern  . Not on file  Social History Narrative   Marital status: single; not dating seriously.     Children: none      Lives: with dad in 2018; parents separated in 2018.  Brothers go back and forth (23, 21, 19).      Employment:  Consulting civil engineertudent      Education: [employer] 01/2015; voice music major.       Tobacco: none      Alcohol: socially      Drugs: none      Sexual activity: dates females and males.    Allergies: No Known Allergies  Metabolic Disorder Labs: Lab Results  Component Value Date   HGBA1C 4.9 10/30/2016   MPG 88 01/05/2016   MPG 108 03/28/2014   Lab Results  Component Value Date   PROLACTIN 7.8 03/28/2014   PROLACTIN 8.8 02/24/2012   Lab Results  Component Value Date   CHOL 147 10/30/2016   TRIG 126 10/30/2016   HDL 75 10/30/2016   CHOLHDL 2.0 10/30/2016   VLDL 16.122.0 08/29/2014   LDLCALC 47 10/30/2016   LDLCALC 62 08/29/2014   Lab Results  Component Value Date   TSH 2.030 10/30/2016   TSH 1.66 01/05/2016    Therapeutic Level Labs: No results found for: LITHIUM No results found for: VALPROATE No components found for:  CBMZ  Current Medications: Current Outpatient Medications  Medication Sig Dispense Refill  . hydrOXYzine (ATARAX/VISTARIL) 10 MG tablet Take 1-2 tab as needed for anxiety 45 tablet 0  . metFORMIN (GLUCOPHAGE-XR) 500 MG 24 hr tablet Take 4 tablets (2,000 mg total) by mouth daily with breakfast. 360 tablet 1  . venlafaxine XR (EFFEXOR XR) 37.5 MG 24 hr capsule Take two capsules (75mg ) daily by mouth 60  capsule 0   Current Facility-Administered Medications  Medication Dose Route Frequency Provider Last Rate Last Dose  . etonogestrel (NEXPLANON) implant 68 mg  68 mg Subdermal Once Myles LippsSantiago, Irma M, MD         Musculoskeletal: Strength & Muscle Tone: within normal limits  Gait & Station: normal Patient leans: N/A  Psychiatric Specialty Exam: Review of Systems  Constitutional: Negative.   HENT: Negative.   Skin: Negative.   Psychiatric/Behavioral: Positive for depression. The patient is nervous/anxious.     Blood pressure 140/80, pulse 93, height 5\' 5"  (1.651 m), weight 223 lb (101.2 kg).There is no height or weight on file to calculate BMI.  General Appearance: Casual and Obese  Eye Contact:  Fair  Speech:  Slow  Volume:  Decreased  Mood:  Anxious and Dysphoric  Affect:  Congruent and Constricted  Thought Process:  Goal Directed  Orientation:  Full (Time, Place, and Person)  Thought Content: Rumination   Suicidal Thoughts:  No  Homicidal Thoughts:  No  Memory:  Immediate;   Good Recent;   Good Remote;   Good  Judgement:  Good  Insight:  Good  Psychomotor Activity:  Decreased  Concentration:  Concentration: Fair and Attention Span: Fair  Recall:  Good  Fund of Knowledge: Good  Language: Good  Akathisia:  No  Handed:  Right  AIMS (if indicated): not done  Assets:  Communication Skills Desire for Improvement Housing Resilience Social Support  ADL's:  Intact  Cognition: WNL  Sleep:  Good   Screenings: GAD-7     Office Visit from 02/25/2017 in Primary Care at [facility] Visit from 10/30/2016 in Primary Care at [facility] Visit from 01/05/2016 in Primary Care at [facility]  Total GAD-7 Score  14  1  6     PHQ2-9     Office Visit from 02/25/2017 in Primary Care at [facility] Visit from 12/25/2016 in Primary Care at [facility] Visit from 10/30/2016 in Primary Care at [facility] Visit from 03/21/2016 in Primary Care at [facility] Visit from 01/31/2016 in Primary  Care at [facility] Total Score  6  0  0  0  0  PHQ-9 Total Score  20  -  -  -  -       Assessment and Plan: Generalized anxiety disorder.  Panic attacks.  Major depressive disorder, recurrent.  Patient doing better on Effexor.  However she does not want to increase the dose since she feel 75 mg working very well for her.  She takes hydroxyzine 10 mg as needed for severe panic attacks.  She is not interested in intensive outpatient program however after some encouragement she willing to try individual counseling.  We will schedule appointment to see a therapist in this office.  Recommended to call us back if she has any question or any concern.  Follow-up in 2-3 months.  Patient going for summer job in [employer].  Time spent 25 minutes.  More than 50% of the time spent in psychoeducation, counseling and coordination of care.  Discuss safety plan that anytime having active suicidal thoughts or homicidal thoughts then patient need to call 911 or go to the local emergency room.     Cleotis Nipper, MD 05/20/2017, 4:21 PM

## 2017-05-20 NOTE — Telephone Encounter (Signed)
Okay to provide 30 day supply. 

## 2017-05-26 ENCOUNTER — Ambulatory Visit (INDEPENDENT_AMBULATORY_CARE_PROVIDER_SITE_OTHER): Payer: BC Managed Care – PPO | Admitting: Licensed Clinical Social Worker

## 2017-05-26 ENCOUNTER — Telehealth (HOSPITAL_COMMUNITY): Payer: Self-pay | Admitting: Professional

## 2017-05-26 ENCOUNTER — Encounter (HOSPITAL_COMMUNITY): Payer: Self-pay | Admitting: Licensed Clinical Social Worker

## 2017-05-26 DIAGNOSIS — F411 Generalized anxiety disorder: Secondary | ICD-10-CM

## 2017-05-26 NOTE — Progress Notes (Signed)
Comprehensive Clinical Assessment (CCA) Note  05/26/2017 Sherry Mercado [id number]  Visit Diagnosis:      ICD-10-CM   1. GAD (generalized anxiety disorder) F41.1       CCA Part One  Part One has been completed on paper by the patient.  (See scanned document in Chart Review)  CCA Part Two A  Intake/Chief Complaint:  CCA Intake With Chief Complaint CCA Part Two Date: 05/26/17 CCA Part Two Time: 1313 Chief Complaint/Presenting Problem: Referred by Dr. Lolly Mustache for anxiety, various types, depression and memory/organizational skills avoidance and life skills, pt would like psycological testing Patients Currently Reported Symptoms/Problems: anxious and depressive symptoms the memory/organziation and avoidance of life skills (these are characteristic of whole life)   Collateral Involvement: Father and dr arfeen's notes Individual's Strengths: motivated, Occupational psychologist, empathetic Individual's Preferences: prefers to not have anxiety and not have these life long symptoms Individual's Abilities: ability to audition and sing opera in front of people with no anxiety Type of Services Patient Feels Are Needed: outpatient  Mental Health Symptoms Depression:  Depression: Difficulty Concentrating, Change in energy/activity, Fatigue, Hopelessness, Sleep (too much or little), Irritability, Increase/decrease in appetite, Tearfulness, Weight gain/loss, Worthlessness  Mania:     Anxiety:   Anxiety: Difficulty concentrating, Irritability, Fatigue, Restlessness, Sleep, Tension, Worrying  Psychosis:     Trauma:     Obsessions:  Obsessions: Attempts to suppress/neutralize, Cause anxiety, Disrupts routine/functioning, Intrusive/time consuming  Compulsions:     Inattention:  Inattention: Disorganized, Avoids/dislikes activities that require focus, Does not follow instructions (not oppositional), Fails to pay attention/makes careless mistakes, Forgetful, Loses things, Poor follow-through on tasks, Symptoms  before age 76  Hyperactivity/Impulsivity:     Oppositional/Defiant Behaviors:     Borderline Personality:     Other Mood/Personality Symptoms:      Mental Status Exam Appearance and self-care  Stature:  Stature: Average  Weight:  Weight: Overweight  Clothing:  Clothing: Casual  Grooming:  Grooming: Normal  Cosmetic use:  Cosmetic Use: None  Posture/gait:  Posture/Gait: Normal  Motor activity:  Motor Activity: Not Remarkable  Sensorium  Attention:  Attention: Distractible  Concentration:     Orientation:  Orientation: X5  Recall/memory:  Recall/Memory: Defective in Recent, Defective in short-term, Defective in immediate  Affect and Mood  Affect:  Affect: Anxious  Mood:  Mood: Anxious  Relating  Eye contact:  Eye Contact: Normal  Facial expression:  Facial Expression: Anxious  Attitude toward examiner:  Attitude Toward Examiner: Cooperative  Thought and Language  Speech flow: Speech Flow: Pressured  Thought content:  Thought Content: Appropriate to mood and circumstances  Preoccupation:     Hallucinations:     Organization:     Company secretary of Knowledge:  Fund of Knowledge: Impoverished by:  (Comment)  Intelligence:  Intelligence: Above Average  Abstraction:  Abstraction: Abstract  Judgement:  Judgement: Normal  Reality Testing:  Reality Testing: Adequate  Insight:  Insight: Fair  Decision Making:  Decision Making: Impulsive  Social Functioning  Social Maturity:  Social Maturity: Impulsive  Social Judgement:  Social Judgement: Naive  Stress  Stressors:  Stressors: Work, Transitions, Illness, Money, Family conflict(lots of anxiety for almost anything)  Coping Ability:  Coping Ability: Deficient supports, Designer, jewellery, Building surveyor Deficits:     Supports:      Family and Psychosocial History: Family history Marital status: Single Does patient have children?: No  Childhood History:  Childhood History By whom was/is the patient raised?: Both  parents Additional childhood history information: good  childhood, went to [employer] 3rd grade-8th, awful 1st grade, skipped 2nd grade, isolated, kids made fun of her, lots of anxiety, didn't participate with other kids, felt no one liked me Description of patient's relationship with caregiver when they were a child: good relationship with both parents Patient's description of current relationship with people who raised him/her: relationship with dad is good, relationship with mother is somewhat troubled but they are working on it. How were you disciplined when you got in trouble as a child/adolescent?: some spankings, time outs, i was pretty behaved Does patient have siblings?: Yes Number of Siblings: 3 Description of patient's current relationship with siblings: good relationship with 3 younger brothers who live. We all live with our father Did patient suffer any verbal/emotional/physical/sexual abuse as a child?: No Did patient suffer from severe childhood neglect?: No Has patient ever been sexually abused/assaulted/raped as an adolescent or adult?: No Was the patient ever a victim of a crime or a disaster?: No Witnessed domestic violence?: No Has patient been effected by domestic violence as an adult?: No  CCA Part Two B  Employment/Work Situation: Employment / Work Psychologist, occupational Employment situation: Leave of absence Patient's job has been impacted by current illness: Yes Describe how patient's job has been impacted: pt is currently on leave from [employer], Undecided if go back to [employer]  What is the longest time patient has a held a job?: worked as a Furniture conservator/restorer and summer camps Has patient ever been in the [employer]?: No Are There Guns or Other Weapons in Your Home?: No  Education: Engineer, civil (consulting) Currently Attending: [employer] Did Garment/textile technologist From [employer]?: Yes Did You Product manager?: Yes Did You Attend Graduate School?: Yes What is Your Geophysicist/field seismologist Degree?: voice degree and working on  voice in graduate school Did You Have Any Difficulty At [employer]?: Yes Were Any Medications Ever Prescribed For These Difficulties?: No  Religion: Religion/Spirituality Are You A Religious Person?: No  Leisure/Recreation: Leisure / Recreation Leisure and Hobbies: i take walks and listen to podcasts, Emergency planning/management officer, read books  Exercise/Diet: Exercise/Diet Do You Exercise?: Yes What Type of Exercise Do You Do?: Run/Walk How Many Times a Week Do You Exercise?: 1-3 times a week Have You Gained or Lost A Significant Amount of Weight in the Past Six Months?: Yes-Gained Number of Pounds Gained: 10(weight swings wildly) Do You Follow a Special Diet?: No Do You Have Any Trouble Sleeping?: No  CCA Part Two C  Alcohol/Drug Use: Alcohol / Drug Use History of alcohol / drug use?: No history of alcohol / drug abuse                      CCA Part Three  ASAM's:  Six Dimensions of Multidimensional Assessment  Dimension 1:  Acute Intoxication and/or Withdrawal Potential:     Dimension 2:  Biomedical Conditions and Complications:     Dimension 3:  Emotional, Behavioral, or Cognitive Conditions and Complications:     Dimension 4:  Readiness to Change:     Dimension 5:  Relapse, Continued use, or Continued Problem Potential:     Dimension 6:  Recovery/Living Environment:      Substance use Disorder (SUD)    Social Function:  Social Functioning Social Maturity: Impulsive Social Judgement: Naive  Stress:  Stress Stressors: Work, Transitions, Illness, Money, Family conflict(lots of anxiety for almost anything) Coping Ability: Deficient supports, Exhausted, Overwhelmed Patient Takes Medications The Way The Doctor Instructed?: Yes Priority Risk: Low Acuity  Risk  Assessment- Self-Harm Potential: Risk Assessment For Self-Harm Potential Thoughts of Self-Harm: No current thoughts(Pt has had previous thoughts, especially this past fall/winter) Method: No plan Availability of Means: No  access/NA  Risk Assessment -Dangerous to Others Potential: Risk Assessment For Dangerous to Others Potential Method: No Plan Availability of Means: No access or NA Intent: Vague intent or NA  DSM5 Diagnoses: Patient Active Problem List   Diagnosis Date Noted  . PCOS (polycystic ovarian syndrome) 05/24/2015  . Anxiety state 09/18/2006  . ADHD 06/12/2006    Patient Centered Plan: Patient is on the following Treatment Plan(s):  To be completed by therapist  Recommendations for Services/Supports/Treatments: Recommendations for Services/Supports/Treatments Recommendations For Services/Supports/Treatments: Partial Hospitalization, Medication Management, Individual Therapy  Treatment Plan Summary:    Referrals to Alternative Service(s): Referred to Alternative Service(s):   Place:   Date:   Time:    Referred to Alternative Service(s):   Place:   Date:   Time:    Referred to Alternative Service(s):   Place:   Date:   Time:    Referred to Alternative Service(s):   Place:   Date:   Time:     Vernona RiegerMACKENZIE,Lavan Imes S

## 2017-05-29 ENCOUNTER — Other Ambulatory Visit: Payer: Self-pay | Admitting: Physician Assistant

## 2017-05-29 NOTE — Telephone Encounter (Signed)
Fluoxetine (Prozac) refill Last OV: 02/25/17 with PCP. Prozac was d/c'd by pt's psychiatrist, Dr. Lolly MustacheArfeen on 04/09/17 Last Refill:10/30/16 Pharmacy:[facility] [address]  PCP: Dr. Nilda SimmerKristi Smith

## 2017-06-02 ENCOUNTER — Other Ambulatory Visit (HOSPITAL_COMMUNITY): Payer: BC Managed Care – PPO | Attending: Psychiatry | Admitting: Licensed Clinical Social Worker

## 2017-06-02 DIAGNOSIS — Z7984 Long term (current) use of oral hypoglycemic drugs: Secondary | ICD-10-CM | POA: Diagnosis not present

## 2017-06-02 DIAGNOSIS — R4589 Other symptoms and signs involving emotional state: Secondary | ICD-10-CM | POA: Insufficient documentation

## 2017-06-02 DIAGNOSIS — Z79899 Other long term (current) drug therapy: Secondary | ICD-10-CM | POA: Insufficient documentation

## 2017-06-02 DIAGNOSIS — F0789 Other personality and behavioral disorders due to known physiological condition: Secondary | ICD-10-CM | POA: Diagnosis present

## 2017-06-02 DIAGNOSIS — F332 Major depressive disorder, recurrent severe without psychotic features: Secondary | ICD-10-CM | POA: Insufficient documentation

## 2017-06-02 DIAGNOSIS — F411 Generalized anxiety disorder: Secondary | ICD-10-CM

## 2017-06-03 ENCOUNTER — Encounter (HOSPITAL_COMMUNITY): Payer: Self-pay | Admitting: Occupational Therapy

## 2017-06-03 ENCOUNTER — Other Ambulatory Visit (HOSPITAL_COMMUNITY): Payer: BC Managed Care – PPO | Admitting: Occupational Therapy

## 2017-06-03 ENCOUNTER — Other Ambulatory Visit (HOSPITAL_COMMUNITY): Payer: BC Managed Care – PPO | Admitting: Licensed Clinical Social Worker

## 2017-06-03 ENCOUNTER — Encounter (HOSPITAL_COMMUNITY): Payer: Self-pay | Admitting: Family

## 2017-06-03 DIAGNOSIS — F411 Generalized anxiety disorder: Secondary | ICD-10-CM

## 2017-06-03 DIAGNOSIS — F0789 Other personality and behavioral disorders due to known physiological condition: Secondary | ICD-10-CM

## 2017-06-03 DIAGNOSIS — R4589 Other symptoms and signs involving emotional state: Secondary | ICD-10-CM

## 2017-06-03 DIAGNOSIS — F332 Major depressive disorder, recurrent severe without psychotic features: Secondary | ICD-10-CM

## 2017-06-03 NOTE — H&P (Signed)
Behavioral Health Partial Program Assessment Note  Date: 06/03/2017 Name: Mario Voong MRN: [id number]  Chief Complaint: Increased anxiety and depression  Brittainy Hargens 27 y.o Caucasian female presents after referral from her Psychiatrist.  Patient reports increased anxiety and depressive symptoms for the past 4 months that has been increasingly worse.  Reports feeling codependent on her boyfriend and father whom she resides with.  Reports she recently had to take a semester off from her masters program due to her anxiety.  Patient reports she is still socially awkward and not engaged with others.  Reports she was recently started on Effexor for depression and anxiety and states she feels like it is helping.  Currently she denies suicidal or homicidal ideation.  Rates her depression as 8 out of 10 anxiety 8 out of 10.  Patient is prescribed Effexor 75 mg reports she is taken and tolerating medications well.  Hydroxyzine is prescribed  however states she has not taken medication as of yet. Malayjah denied any previous inpatient admissions.  Reported family history of bipolar depression reports younger brother has psychosis.  Kimbrely denies illicit drug use however reports using marijuana occasionally. States she is a social drinker i.e. 2 glasses a week.  Denies insomnia or decreased appetite.  Support and encouragement and reassurance was provided.   HPI: Patient is a 27 y.o. Caucasian female presents with depression and anxiety.Patient was enrolled in partial psychiatric program on 06/03/17.  Primary complaints include: difficulty with school and poor concentration.  Onset of symptoms was gradual with gradually worsening course since that time. Psychosocial Stressors include the following: Educational stressors .   I have reviewed the following documentation dated 06/03/2017:  Complaints of Pain: nonear Past Psychiatric History:  Depression with suicidal ideaitons  Currently in treatment  with Venlafaxine XR 75mg  PO QD and Hydroxyzine 10mg  PO TID  Substance Abuse History: marijuana Use of Alcohol: occasional, social use 2 glasses of wine Use of Caffeine: coffee 2 /day Use of over the counter:  Past Surgical History:  Procedure Laterality Date  . EYE SURGERY     tear duct procedure    Past Medical History:  Diagnosis Date  . Allergy    Zyrtec PRN  . Anxiety   . Depression    Outpatient Encounter Medications as of 06/03/2017  Medication Sig  . hydrOXYzine (ATARAX/VISTARIL) 10 MG tablet Take 1 tab as needed for anxiety  . metFORMIN (GLUCOPHAGE-XR) 500 MG 24 hr tablet Take 4 tablets (2,000 mg total) by mouth daily with breakfast.  . venlafaxine XR (EFFEXOR-XR) 75 MG 24 hr capsule Take 1 capsule (75 mg total) by mouth daily with breakfast. Take two capsules (75mg ) daily by mouth   Facility-Administered Encounter Medications as of 06/03/2017  Medication  . etonogestrel (NEXPLANON) implant 68 mg   No Known Allergies  Social History   Tobacco Use  . Smoking status: Never Smoker  . Smokeless tobacco: Never Used  Substance Use Topics  . Alcohol use: Yes    Alcohol/week: 0.0 oz    Comment: social   Functioning Relationships: good support system, good relationship with spouse or significant other and alone & isolated Education: College       Please specify degree: Master program Other Pertinent History: Family History  Problem Relation Age of Onset  . Hearing loss Mother   . Bipolar disorder Mother   . Depression Mother   . Anxiety disorder Mother   . Bipolar disorder Brother   . Anxiety disorder Brother   . Cancer Maternal  Grandmother        breast cancer  . Bipolar disorder Maternal Grandmother   . Depression Maternal Grandmother   . Hearing loss Maternal Grandfather   . Diabetes Father   . Anxiety disorder Father   . Deep vein thrombosis Father        After immobilization from surgery     Review of Systems Constitutional:  negative  Objective:  There were no vitals filed for this visit.  Physical Exam:   Mental Status Exam: Appearance:  Well groomed Psychomotor::  Within Normal Limits Attention span and concentration: Normal Behavior: calm and cooperative Speech:  slow and soft Mood:  depressed and anxious Affect:  flat and mood-congruent Thought Process:  Coherent Thought Content:  Hallucinations: None Orientation:  person, place and month of year Cognition:  grossly intact Insight:  Intact Judgment:  Fair Estimate of Intelligence: Average Fund of knowledge: Aware of current events Memory: Recent and remote intact Abnormal movements: None Gait and station: Normal  Assessment:  Diagnosis: Severe episode of recurrent major depressive disorder, without psychotic features (HCC) [F33.2] 1. Severe episode of recurrent major depressive disorder, without psychotic features (HCC)       Plan: Admit to Partial Hospitalization Program Patient's current medications are to be continue: Depression/ Anxeity:  Continue  Venlafaxine 75 mg PO QD  Continue Hydroxyzine 10 mg PO TID PRN    Comprehensive treatment plan will be developed and side effects of medications have been reviewed with patient Orders placed for OT  Treatment plan was reviewed and agreed upon by NP Chilton Greathouse Jaileen Janelle and patient Threasa HeadsKayla Hershman need for group service.  Treatment options and alternatives reviewed with patient and patient understands the above plan.      Oneta Rackanika N Len Azeez, NP

## 2017-06-04 ENCOUNTER — Encounter (HOSPITAL_COMMUNITY): Payer: Self-pay

## 2017-06-04 ENCOUNTER — Other Ambulatory Visit (HOSPITAL_COMMUNITY): Payer: BC Managed Care – PPO | Admitting: Licensed Clinical Social Worker

## 2017-06-04 ENCOUNTER — Other Ambulatory Visit: Payer: Self-pay

## 2017-06-04 VITALS — BP 143/84 | HR 76 | Temp 98.3°F | Resp 16 | Wt 221.8 lb

## 2017-06-04 DIAGNOSIS — F411 Generalized anxiety disorder: Secondary | ICD-10-CM

## 2017-06-04 NOTE — Progress Notes (Signed)
Sherry Mercado presents quite anxious and guarded, but opened more during assessment. Alert and oriented no acute distress noted. States she feels like everything is going OK. Feels like her medications are working for and did not reported any unusual side effects. Did express some concern over not disclosing a pre-existing prescription for an axiolytic prn. States she forgot to mention it to the Doctor she saw here the other day. Updated in Medications, see entry. She rates her Depression at a 3 on a 1-10 scale (1-no Depression and 10- most Depressed). She rates her Anxiety a 4 on a scale of 1-10 (1-no Anxiety and 10- most Anxious). Reports her appetite as good. States she feels like her sleep is not the best. Reports easily falling to sleep but wakes up at intervals. States she feels like she may have sleep apnea r/t her poor sleep and family/friends reporting that she snores. States she is working on a sleep study consult. When asked about her goals she replied, "I want to improve my internal coping skills, build my confidence and get back to school." Explored things that have a tendency to boost her confidence. She states she likes completing things, performing and helping people. Explored options to meet those needs while she is away from school and has some time. States she intends to go [employer] ([employer] executive[employer]ir practice). States she likes [employer] at [employer] but had gotten away from it. Also discussed potentially providing signing lessons in the Fall and starting to work on that now. She also mentioned the thought of taking cooking lessons as she has always been interested in food and cooking. States she is unsure when she may return to school, but is intent on completing it as she only has a year remaining. Denies SI/HI and AVH. Denies substance abuse. Denies abusing anyone or being a current victim of abuse.  Support and encouragement provided. She offered no additional questions or concerns.

## 2017-06-04 NOTE — Therapy (Signed)
[facility] PARTIAL HOSPITALIZATION PROGRAM [address] [place], [place], [address] Phone: [phone]   Fax:  [phone]  Occupational Therapy Evaluation and Treatment  Patient Details  Name: Sherry Mercado MRN: [id number] Date of Birth: 04-Oct-1990 Referring Provider: Hillery Jacks, NP   Encounter Date: 06/03/2017  OT End of Session - 06/04/17 1618    Visit Number  1    Number of Visits  6    Date for OT Re-Evaluation  06/27/17    Authorization Type  BCBS    OT Start Time  1030    OT Stop Time  1130    OT Time Calculation (min)  60 min    Activity Tolerance  Patient tolerated treatment well    Behavior During Therapy  [facility] for tasks assessed/performed       Past Medical History:  Diagnosis Date  . Allergy    Zyrtec PRN  . Anxiety   . Depression     Past Surgical History:  Procedure Laterality Date  . EYE SURGERY     tear duct procedure    There were no vitals filed for this visit.  Subjective Assessment - 06/03/17 1302    Currently in Pain?  No/denies        [facility] OT Assessment - 06/04/17 1615      Assessment   Medical Diagnosis  GAD    Referring Provider  Hillery Jacks, NP    Onset Date/Surgical Date  -- chronic      Precautions   Precautions  None      Balance Screen   Has the patient fallen in the past 6 months  No    Has the patient had a decrease in activity level because of a fear of falling?   No    Is the patient reluctant to leave their home because of a fear of falling?   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 The individual is likely in a passivity state or manifests non-goal-directed behavior. This is considered a type of motivational deficit and warrants motivational interventions.        Assessment:  Patient demonstrates impersonal/amotivational behaviors. This is considered a type of motivational deficit and warrants motivational interventions. Patient will benefit from occupational therapy intervention in order to improve time management, financial management, stress management, job readiness skills, social skills, sleep hygiene, exercise and healthy eating habits, and health management skills and other psychosocial skills needed for preparation to return to full time community living and to be a productive community member.     Plan:  Patient will participate in skilled occupational therapy sessions individually or in a group setting to improve coping skills, psychosocial skills, and emotional skills required to return to prior level of function  as a productive community member. Treatment will be 1-2 times per week for 2-6 weeks.         OT Group: Stress Management   S:  "Anything that is scheduled is stressful for me." O:  Skilled OT group focused on stress management this date.   Group opened with members identifying 2 stressful situations they have encountered.  Group weighed in on their personal level of stress with each situation.  Group discussed what causes varying levels of stress for the same situation.  Group discussed recurrent physical and psychological stress can diminish self-esteem, decrease interpersonal and academic effectiveness and create a cycle of self-blame and  self-doubt.  Group was educated on importance of identifying symptoms of stress, events are not stressful but rather our interpretations and reactions to event that are stressful.  Group member took a stress symptom checklist and discussed results.  Group discussed poor coping mechanisms including self medication, suppression, passivity, acting out, blaming/complaining), why it may work short term, as well as possible long term complications; also discussed alternative positive coping strategies.  Group discussed and learned positive coping mechanisms to stress including: relaxation, positive mental attitude, support, self-care and lifestyle changes, interpersonal strategies, emotional strategies, cognitive strategies, and philosophical strategies.  Group member was given handouts with suggestions to decrease and manage stress. Group member also filled out stress ladder hierarchy worksheet and was asked to write one positive coping strategy to work towards for each stressor.   A:  Patient participated in skilled occupational therapy group for stress management skills this date.  Patient was engaged and open to strategies introduced. P:  Continue skilled OT group, communication skills and self-discipline.          OT Short Term Goals - 06/04/17 1619      OT SHORT TERM GOAL #1   Title  Patient will be educated on strategies to improve psychosocial skills needed to participate fully in all daily, work, and leisure activities.    Time  3    Period  Weeks    Status  New    Target Date  06/27/17      OT SHORT TERM GOAL #2   Title  Patient will be educated on a HEP and independent with implementation of HEP.    Time  3    Period  Weeks    Status  New      OT SHORT TERM GOAL #3   Title  Patient will independently apply psychosocial skills and coping mechanisms to her daily activities in order to function independently.    Time  3    Period  Weeks    Status  New               Plan -  06/04/17 1618    Occupational performance deficits (Please refer to evaluation for details):  ADL's;IADL's;Rest and Sleep;Education;Work;Leisure;Social Participation    Rehab Potential  Good    OT Frequency  2x / week    OT Duration  -- 3 weeks    OT Treatment/Interventions  Self-care/ADL training;Psychosocial skills training;Coping strategies training;Patient/family education;Other (comment) community reintegration    Clinical Decision Making  Limited treatment options, no task modification necessary    Consulted and Agree with Plan of Care  Patient       Patient will benefit from skilled therapeutic intervention in order to improve the following deficits and impairments:  Decreased coping skills, Decreased psychosocial skills, Other (comment)(decreased participation in ADLs)  Visit Diagnosis: GAD (generalized anxiety  disorder)  Difficulty coping  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 PCOS (polycystic ovarian syndrome) 05/24/2015  . Anxiety state 09/18/2006  . ADHD 06/12/2006   Ezra SitesLeslie Troxler, OTR/L  [phone] 06/04/2017, 4:20 PM  [facility] PART[facility] H[facility] PROGRAM [address] [place], [place], [address] Phone: [phone]   Fax:  [phone]  Name: Sherry Mercado MRN: [id number] Date of Birth: 04-Apr-1990

## 2017-06-05 ENCOUNTER — Other Ambulatory Visit (HOSPITAL_COMMUNITY): Payer: BC Managed Care – PPO | Admitting: Specialist

## 2017-06-05 ENCOUNTER — Encounter (HOSPITAL_COMMUNITY): Payer: Self-pay | Admitting: Specialist

## 2017-06-05 ENCOUNTER — Other Ambulatory Visit (HOSPITAL_COMMUNITY): Payer: BC Managed Care – PPO | Admitting: Licensed Clinical Social Worker

## 2017-06-05 DIAGNOSIS — F0789 Other personality and behavioral disorders due to known physiological condition: Secondary | ICD-10-CM

## 2017-06-05 DIAGNOSIS — F411 Generalized anxiety disorder: Secondary | ICD-10-CM

## 2017-06-05 DIAGNOSIS — R4589 Other symptoms and signs involving emotional state: Secondary | ICD-10-CM

## 2017-06-05 DIAGNOSIS — F332 Major depressive disorder, recurrent severe without psychotic features: Secondary | ICD-10-CM

## 2017-06-05 NOTE — Progress Notes (Signed)
GROUP NOTE - spiritual care group 06/04/2017 10:30 - 12:00 ?Facilitated by Wilkie Ayehaplain Lindon Kiel, MDiv   Group focused on topic of strength. ?Group members reflected on what thoughts and feelings emerge when they hear this topic. ?They then engaged in therapeutic art activity. ?Reflected on The topic of strength and represented what strength had been to them in their lives (images and patterns given) and what they saw as helpful in their life now. ?What they needed / wanted. ??Engaged in facilitated sharing of insights from activity.  Activity drew on narrative framework   Dorathy DaftKayla arrived at group after introductions, as she was meeting with other care team members.  She engaged in discussion, responding to facilitator and connecting with another group member in discussing art project.  She identified reconfiguring her idea of strength from meeting external expectations for "success" to allowing herself grace and connecting with her own path toward her goals.  Connected with another group member in  hoping to carry over a sense of "drive" from her previous sense of strength - but reimagine this characteristic.    [facility] Chaplain Burnis KingfisherMatthew Jaye Polidori, MDiv, [facility]

## 2017-06-05 NOTE — Therapy (Signed)
[facility] PARTIAL HOSPITALIZATION PROGRAM [address] [place], [place], [address] Phone: [phone]   Fax:  [phone]  Occupational Therapy Treatment  Patient Details  Name: Sherry Mercado MRN: [id number] Date of Birth: [birth year] Referring Provider: Hillery Jacks, NP   Encounter Date: 06/05/2017  OT End of Session - 06/05/17 2134    Visit Number  2    Number of Visits  6    Date for OT Re-Evaluation  06/27/17    Authorization Type  BCBS    OT Start Time  1020    OT Stop Time  1120    OT Time Calculation (min)  60 min    Activity Tolerance  Patient tolerated treatment well    Behavior During Therapy  [facility] for tasks assessed/performed       Past Medical History:  Diagnosis Date  . Allergy    Zyrtec PRN  . Anxiety   . Depression     Past Surgical History:  Procedure Laterality Date  . EYE SURGERY     tear duct procedure    There were no vitals filed for this visit.  Subjective Assessment - 06/05/17 2133    Subjective   I get 8 to 9 hours of sleep.      Currently in Pain?  No/denies         [facility] OT Assessment - 06/04/17 1615      Assessment   Medical Diagnosis  GAD    Referring Provider  Hillery Jacks, NP    Onset Date/Surgical Date  -- chronic      Precautions   Precautions  None      Balance Screen   Has the patient fallen in the past 6 months  No    Has the patient had a decrease in activity level because of a fear of falling?   No    Is the patient reluctant to leave their home because of a fear of falling?   No          O:  patient participated in skilled OT group focusing on improving sleep hygiene.  Patient was educated and discussed benefits of getting enough sleep, detriments of getting too much or too little sleep.  Patient and group discussed strategies for improving sleep including routines, environmental factors, diet, exercises, calming activities.  Patient was educated on use of sleep diary to identify  current sleep patterns and strategies to improve sleep routine.                 OT Education - 06/05/17 2133    Education provided  Yes    Education Details  educated on strategies for improved sleep hygiene    Person(s) Educated  Patient    Methods  Explanation;Handout    Comprehension  Verbalized understanding       OT Short Term Goals - 06/05/17 2136      OT SHORT TERM GOAL #1   Title  Patient will be educated on strategies to improve psychosocial skills needed to participate fully in all daily, work, and leisure activities.    Time  3    Period  Weeks    Status  On-going      OT SHORT TERM GOAL #2   Title  Patient will be educated on a HEP and independent with implementation of HEP.    Time  3    Period  Weeks    Status  On-going      OT  SHORT TERM GOAL #3   Title  Patient will independently apply psychosocial skills and coping mechanisms to her daily activities in order to function independently.    Time  3    Period  Weeks    Status  On-going               Plan - 06/05/17 2134    Clinical Impression Statement  A:  Patient was engaged throughout group.  She states she does have trouble sleeping and some nightmares and has often slept all day and then stayed up at night, which leaves her groggy.  Dorathy DaftKayla commited to trying a calming activity, such as reading as part of her bed time routine.    Plan  P:  Continue skilled OT intervention.  Follow up on HEP.  Next group to focus on job reentry stressors.       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 PCOS (polycystic ovarian syndrome) 05/24/2015  . Anxiety state 09/18/2006  . ADHD 06/12/2006    Shirlean MylarBethany H. Jaxtyn Linville, MHA, OTR/L [phone]  06/05/2017, 9:38 PM  [facility] PART[facility]GRAM [address] [place], [place], [address] Phone: [phone]   Fax:  [phone]  Name: Sherry Mercado MRN: [id number] Date of Birth: [birth year]

## 2017-06-05 NOTE — Psych (Signed)
   [facility] THERAPIST [facility]  Sherry Mercado [id number]  Session Time: 9:00 - 10:30  Participation Level: Active  Behavioral Response: CasualAlertAnxious  Type of Therapy: Group Therapy  Treatment Goals addressed: Coping  Interventions: CBT, DBT, Strength-based, Supportive and Reframing  Summary: Clinician led check-in regarding current stressors and situation, and review of patient completed daily inventory. Clinician utilized active listening and empathetic response and validated patient emotions. Clinician facilitated processing group on pertinent issues.   Therapist Response: Sherry Mercado is a 27 y.o. female who presents with anxiety symptoms. Patient arrived within time allowed and reports that she is feeling "optimistic." Patient rates her mood at a 5 on a scale of 1-10 with 10 being great. Pt reports that she has been nervous about coming to group however feels this will help. Pt shares she had her wisdom teeth removed last week and is still recovering.  Patient reports wanting to make progress on being able to go out of the house alone and engage with others. Patient engaged in discussion.     Session Time: 10:30 - 12:00   Participation Level: Active  Behavioral Response: CasualAlertAnxious  Type of Therapy: Group Therapy, Psychotherapy  Treatment Goals addressed: Coping  Interventions: CBT, DBT, Solution Focused, Supportive and Reframing  Summary:  Clinician introduced topic of cognitive distortions. Cln educated on what cognitive distortions are and how they affect us. Cln introduced "Catch, Challenge, Change" and group reviewed cognitive distortion handout and came up with examples to work on "catch."   Therapist Response: Patient engaged activity and discussion. Pt was able to identify real life examples of cognitive distortions discussed.       Session Time: 12:00 - 12:45  Participation Level: Active  Behavioral Response:  CasualAlertAnxious  Type of Therapy: Group Therapy, Activity Therapy  Treatment Goals addressed: Coping  Interventions: Psychologist, occupationalocial Skills Training, Supportive  Summary:  Reflection Group: Patients encouraged to practice skills and interpersonal techniques or work on mindfulness and relaxation techniques. The importance of self-care and making skills part of a routine to increase usage were stressed   Therapist Response: Patient engaged and participated appropriately.       Session Time: 12:45- 2:00  Participation Level: Active  Behavioral Response: CasualAlertAnxious  Type of Therapy: Group Therapy, Psychoeducation; Psychotherapy  Treatment Goals addressed: Coping  Interventions: CBT; Solution focused; Supportive; Reframing  Summary: 12:45 - 1:50: Cln continued topic of cognitive distortions. Group discussed how to "challenge" the unhealthy thought patterns once recognized. Group reviewed "Challenges to Negative Thinking" handout and went over handout with irrational thought examples and discussed how to challenge those thoughts. 1:50 -2:00 Clinician led check-out. Clinician assessed for immediate needs, medication compliance and efficacy, and safety concerns   Therapist Response: Patient engaged in activity. Pt states understanding of how to challenge unhealthy thoughts and successfully reframed examples in discussion. At check-out, patient rates her mood at a 6.5 on a scale of 1-10 with 10 being great. Patient reports plans of taking a nap. Patient demonstrates some progress as evidenced by participation in first session. Patient denies SI/HI/self-harm at the end of group.      Suicidal/Homicidal: Nowithout intent/plan   Plan: Pt will continue in PHP while working to decrease anxiety symptoms and increase ability to self manage those symptoms.   Diagnosis: GAD (generalized anxiety disorder) [F41.1]    1. GAD (generalized anxiety disorder)       Donia GuilesJenny Mahum Betten,  LCSW 06/05/2017

## 2017-06-06 ENCOUNTER — Other Ambulatory Visit (HOSPITAL_COMMUNITY): Payer: BC Managed Care – PPO | Admitting: Licensed Clinical Social Worker

## 2017-06-06 DIAGNOSIS — F332 Major depressive disorder, recurrent severe without psychotic features: Secondary | ICD-10-CM

## 2017-06-06 DIAGNOSIS — F411 Generalized anxiety disorder: Secondary | ICD-10-CM | POA: Diagnosis not present

## 2017-06-09 ENCOUNTER — Other Ambulatory Visit (HOSPITAL_COMMUNITY): Payer: BC Managed Care – PPO | Admitting: Licensed Clinical Social Worker

## 2017-06-09 DIAGNOSIS — F411 Generalized anxiety disorder: Secondary | ICD-10-CM | POA: Diagnosis not present

## 2017-06-09 DIAGNOSIS — F332 Major depressive disorder, recurrent severe without psychotic features: Secondary | ICD-10-CM

## 2017-06-09 NOTE — Psych (Signed)
   [facility]  Sherry Mercado [id number]  Session Time: 9:00 - 10:15  Participation Level: Active  Behavioral Response: CasualAlertAnxious  Type of Therapy: Group Therapy  Treatment Goals addressed: Coping  Interventions: CBT, DBT, Strength-based, Supportive and Reframing  Summary: Clinician led check-in regarding current stressors and situation, and review of patient completed daily inventory. Clinician utilized active listening and empathetic response and validated patient emotions. Clinician facilitated processing group on pertinent issues.   Therapist Response: Sherry HeadsKayla Mercado is a 27 y.o. female who presents with anxiety symptoms. Patient arrived within time allowed and reports that she is feeling "neutral." Pt rates her mood at a 5 on a scale of 1-10 with 10 being great at the end of group. Pt reports her performance went well yesterday. Pt shares she talked to her mom and she was supportive about pt leaving school. This lifted a weight off pt.  Pt reports that she is still working on catastrophic thinking. Pt engaged in discussion.       Session Time: 10:15 -11:00  Participation Level: Active  Behavioral Response: CasualAlertDepressed  Type of Therapy: Group Therapy, psychoeducation, psychotherapy  Treatment Goals addressed: Coping  Interventions: CBT, DBT, Solution Focused, Supportive and Reframing  Summary:  Clinician introduced topic of goals and values. Group members completed worksheet "values and priorities list" and rated top priorities in their current life. Patients discussed if they were living in line with their values.    Therapist Response: Patient participated and reports their top priority which they need to work to live in line with is being self-directed.      Session Time: 11:00 - 12:00  Participation Level: Active  Behavioral Response: CasualAlertDepressed  Type of Therapy: Group Therapy,  Psychotherapy  Treatment Goals addressed: Coping  Interventions: CBT, Solution focused, Supportive, Reframing  Summary:  Clinician continued topic of goals and values. Group worked to determine how to take a value and convert it into action steps.     Therapist Response: Patient engaged in group. Pt determined action step of working on driving.         Session Time: 12:00- 1:00  Participation Level: Active  Behavioral Response: CasualAlertDepressed  Type of Therapy: Group Therapy, Psychoeducation; Psychotherapy  Treatment Goals addressed: Coping  Interventions: CBT; Solution focused; Supportive; Reframing  Summary: 12:00 - 12:50 Group watched "The Agony of Unsubscribing" TedTalk and discussed the topic of  Perspective and how it can change our view of reality. 12:50 -1:00 Clinician led check-out. Clinician assessed for immediate needs, medication compliance and efficacy, and safety concerns   Therapist Response: Patient engaged activity and discussion. Pt reports understanding of how perception affects reality.  At check-out, patient rates her mood at a 6 on a scale of 1-10 with 10 being great. Patient reports plans of date night with her boyfriend.  Patient demonstrates some progress as evidenced by continued efforts to utilize skills. Patient denies SI/HI/self-harm at the end of group.      Suicidal/Homicidal: Nowithout intent/plan   Plan: Pt will continue in PHP while working to decrease anxiety symptoms and increase ability to self manage those symptoms.   Diagnosis: GAD (generalized anxiety disorder) [F41.1]    1. GAD (generalized anxiety disorder)   2. Severe episode of recurrent major depressive disorder, without psychotic features (HCC)       Sherry GuilesJenny Renaud Celli, LCSW 06/09/2017

## 2017-06-09 NOTE — Psych (Signed)
   [facility]  Sherry HeadsKayla Mercado [id number]  Session Time: 9:00 - 10:45  Participation Level: Active  Behavioral Response: CasualAlertAnxious  Type of Therapy: Group Therapy  Treatment Goals addressed: Coping  Interventions: CBT, DBT, Strength-based, Supportive and Reframing  Summary: Clinician led check-in regarding current stressors and situation, and review of patient completed daily inventory. Clinician utilized active listening and empathetic response and validated patient emotions. Clinician facilitated processing group on pertinent issues.   Therapist Response: Sherry Mercado is a 27 y.o. female who presents with anxiety symptoms. Pt arrived within time allowed and reports that she is feeling "pretty good." Pt rates her mood at a "7" on a scale of 1-10 with 10 being great. Pt reports that she slept more than usual. Pt shared she had a dream about kittens and believes that's why she woke up in a good mood. Pt stated that she has a [place] tomorrow for an [place] [employer] that she is excited, yet nervous about. Pt reports that she is still working on being less dependent on others. Pt engaged in discussion.      Session Time: 10:45 -12:15  Participation Level: Active  Behavioral Response: CasualAlertAnxious  Type of Therapy: Group Therapy, psychotherapy  Treatment Goals addressed: Coping  Interventions: Strengths based, reframing, Supportive,   Summary:  Spiritual Care group  Therapist Response: Patient engaged in group. See chaplain note.      Session Time: 12:15 - 1:00  Participation Level: Active  Behavioral Response: CasualAlertAnxious  Type of Therapy: Group Therapy, Activity Therapy  Treatment Goals addressed: Coping  Interventions: Psychologist, occupationalocial Skills Training, Supportive  Summary:  Reflection Group: Patients encouraged to practice skills and interpersonal techniques or work on mindfulness and relaxation techniques.  The importance of self-care and making skills part of a routine to increase usage were stressed   Therapist Response: Patient engaged and participated appropriately.     Session Time: 1:00- 2:00  Participation Level: Active  Behavioral Response: CasualAlertAnxious  Type of Therapy: Group Therapy, Psychoeducation, Activity therapy  Treatment Goals addressed: Coping  Interventions: CBT, DBT, Solution Focused, Supportive and Reframing  Summary: 1:00 - 1:50: Clinician began boundaries discussion. Clinician explained the difference between rigid, porous, and healthy boundaries.  Patients identified what boundaries they think they have in different areas of life. 1:50 -2:00 Clinician led check-out. Clinician assessed for immediate needs, medication compliance and efficacy, and safety concerns   Therapist Response: Patient engaged in activity and discussion. Pt shared that she has healthy boundaries with her father, as he respects how introverted she is. Pt also feels that she has porous boundaries with some of her friends because she always resorts to saying "yes" to request even if she doesn't feel comfortable doing so.  At checkout, Patient rates her mood at a 7 on a scale of 1-10 at the end of group. 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 Pt demonstrates some progress as  evidenced by working on ways to decrease her anxiety around driving and going to rehearsal. Pt denies SI/HI/self-harm at the end of group.   Suicidal/Homicidal: Nowithout intent/plan   Plan: Pt will continue in PHP while working to decrease anxiety symptoms and increase ability to self manage those symptoms.   Diagnosis: GAD (generalized anxiety disorder) [F41.1]    1. GAD (generalized anxiety disorder)       Quinn AxeWhitney J Maricella Filyaw, LPCA, LCASA 06/09/2017

## 2017-06-09 NOTE — Psych (Signed)
[facility]  Sherry Mercado [id number]  Session Time: 9:00 - 10:30  Participation Level: Active  Behavioral Response: CasualAlertAnxious  Type of Therapy: Group Therapy  Treatment Goals addressed: Coping  Interventions: CBT, DBT, Strength-based, Supportive and Reframing  Summary: Clinician led check-in regarding current stressors and situation, and review of patient completed daily inventory. Clinician utilized active listening and empathetic response and validated patient emotions. Clinician facilitated processing group on pertinent issues.   Therapist Response: Sherry Mercado is a 27 y.o. female who presents with anxiety symptoms. Patient arrived within time allowed and reports that she is feeling "tired." Patient rates her mood at a 4 on a scale of 1-10 with 10 being great. Pt reports that she had difficulty waking up this morning and forgot to take the pain medication for her jaw so she is sore. Pt states having an uneventful evening. Patient reports wanting to make progress on being able to manage anxiety. Patient engaged in discussion.     Session Time: 10:30 -11:30   Participation Level: Active   Behavioral Response: CasualAlertDepressed   Type of Therapy: Group Therapy, OT   Treatment Goals addressed: Coping   Interventions: Psychosocial skills training, Supportive,    Summary:  Occupational Therapy group   Therapist Response: Patient engaged in group. See OT note.           Session Time: 11:30 - 12:15   Participation Level: Active   Behavioral Response: CasualAlertDepressed   Type of Therapy: Group Therapy, Psychotherapy   Treatment Goals addressed: Coping   Interventions: CBT, Solution focused, Supportive, Reframing   Summary: Cln led psychotherapy group on feelings associated with feeling dismissed and navigating the way other people handle mental health. Group discussed ways they felt medical professionals,  friends/family, and other looser acquaintances treat their mental health diagnoses.    Therapist Response: Patient engaged in group. Pt expressed frustrations regarding the way others treat mental health issues and reports experiences of feeling dismissed, misunderstood, and blamed.        Session Time: 12:15 - 1:00  Participation Level: Active  Behavioral Response: CasualAlertDepressed  Type of Therapy: Group Therapy, Activity Therapy  Treatment Goals addressed: Coping  Interventions: Psychologist, occupationalocial Skills Training, Supportive  Summary:  Reflection Group: Patients encouraged to practice skills and interpersonal techniques or work on mindfulness and relaxation techniques. The importance of self-care and making skills part of a routine to increase usage were stressed   Therapist Response: Patient engaged and participated appropriately.       Session Time: 12:45- 2:00  Participation Level: Active  Behavioral Response: CasualAlertAnxious  Type of Therapy: Group Therapy, Psychoeducation; Psychotherapy  Treatment Goals addressed: Coping  Interventions: CBT; Solution focused; Supportive; Reframing  Summary: 12:45 - 1:50: Clinician introduced topic of fear setting. Group viewed TED talk "Why you should define your fears instead of your goals."  Group utilized fear setting model to work through a current fear and analyze it. 1:50 -2:00 Clinician led check-out. Clinician assessed for immediate needs, medication compliance and efficacy, and safety concerns   Therapist Response: Patient engaged activity and discussion. Pt completed fear setting model on current fear of telling her mother that she withdrew from graduate school.  Pt reports gaining insight.  At check-out, patient rates her mood at a 5 on a scale of 1-10 with 10 being great. Patient reports plans of resting and maybe going to choral practice.  Patient demonstrates some progress as evidenced by open participation in group.  Patient denies SI/HI/self-harm  at the end of group.     Suicidal/Homicidal: Nowithout intent/plan   Plan: Pt will continue in PHP while working to decrease anxiety symptoms and increase ability to self manage those symptoms.   Diagnosis: Severe episode of recurrent major depressive disorder, without psychotic features (HCC) [F33.2]    1. Severe episode of recurrent major depressive disorder, without psychotic features (HCC)   2. GAD (generalized anxiety disorder)       [name], LCSW 06/09/2017

## 2017-06-09 NOTE — Psych (Signed)
[facility] PHP THERAPIST PROGRESS NOTE  Sherry Mercado [id number]  Session Time: 9:00 - 10:30  Participation Level: Active  Behavioral Response: CasualAlertAnxious  Type of Therapy: Group Therapy  Treatment Goals addressed: Coping  Interventions: CBT, DBT, Strength-based, Supportive and Reframing  Summary: Clinician led check-in regarding current stressors and situation, and review of patient completed daily inventory. Clinician utilized active listening and empathetic response and validated patient emotions. Clinician facilitated processing group on pertinent issues.   Therapist Response: Sherry Mercado is a 27 y.o. female who presents with anxiety symptoms. Patient arrived within time allowed and reports that she is feeling "neutral." Pt rates her mood at a "5" on a scale of 1-10 with 10 being great at the end of group. 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 Pt shared that she is nervous but feels good about performing. Pt reports that she is still working on her ability to get up in the morning. Pt engaged in discussion.       Session Time: 10:30 -11:30   Participation Level: Active   Behavioral Response: CasualAlertDepressed   Type of Therapy: Group Therapy, OT   Treatment Goals addressed: Coping   Interventions: Psychosocial skills training, Supportive,    Summary:  Occupational Therapy group   Therapist Response: Patient engaged in group. See OT note.           Session Time: 11:30 - 12:15   Participation Level: Active   Behavioral Response: CasualAlertDepressed   Type of Therapy: Group Therapy, Psychotherapy   Treatment Goals addressed: Coping   Interventions: CBT, Solution focused, Supportive, Reframing   Summary: Cln continued discussion on boundaries, discussing the different types of boundaries. Pt shared which types of boundaries they identify with.   Therapist Response: Pt engaged in activity and  discussion. Pt reported that she struggles with setting healthy time boundaries with her mom. Pt shared that she has rigid time boundaries with her mom and knows that she does not make the proper amount of time for her, often dismissing her request to see her.        Session Time: 12:15 - 1:00  Participation Level: Active  Behavioral Response: CasualAlertDepressed  Type of Therapy: Group Therapy, Activity Therapy  Treatment Goals addressed: Coping  Interventions: Psychologist, occupational, Supportive  Summary:  Reflection Group: Patients encouraged to practice skills and interpersonal techniques or work on mindfulness and relaxation techniques. The importance of self-care and making skills part of a routine to increase usage were stressed   Therapist Response: Patient engaged and participated appropriately.       Session Time: 12:45- 2:00  Participation Level: Active  Behavioral Response: CasualAlertAnxious  Type of Therapy: Group Therapy, Psychoeducation; Psychotherapy  Treatment Goals addressed: Coping  Interventions: CBT; Solution focused; Supportive; Reframing  Summary: 12:45 - 1:50: Cln continued discussion on boundaries, discussing the different types of boundaries. Pt shared which types of boundaries they identify with. Pts completed a Boundaries Exploration worksheet and identified how create to a healthy boundary. 1:50 -2:00 Clinician led check-out. Clinician assessed for immediate needs, medication compliance and efficacy, and safety concerns   Therapist Response: Pt engaged in activity and discussion. Pt reported that she is going to work on calling her mom once every few days and make plans ahead for them to get together. Pt stated she hopes this will make for healthy time boundaries.   At check-out, patient rates her mood at a 6 on a scale of 1-10  with 10 being great. Pt reports she has her [employer] tonight and is very excited about it. Pt demonstrates some  progress as evidenced by having insight on which boundaries she needs to improve in her life. Pt denies SI/HI/self-harm at the end of group.      Suicidal/Homicidal: Nowithout intent/plan   Plan: Pt will continue in PHP while working to decrease anxiety symptoms and increase ability to self manage those symptoms.   Diagnosis: GAD (generalized anxiety disorder) [F41.1]    1. GAD (generalized anxiety disorder)   2. Severe episode of recurrent major depressive disorder, without psychotic features (HCC)       Sherry GuilesJenny Bret Stamour, LCSW 06/09/2017

## 2017-06-10 ENCOUNTER — Other Ambulatory Visit (HOSPITAL_COMMUNITY): Payer: BC Managed Care – PPO | Admitting: Occupational Therapy

## 2017-06-10 ENCOUNTER — Encounter (HOSPITAL_COMMUNITY): Payer: Self-pay | Admitting: Occupational Therapy

## 2017-06-10 ENCOUNTER — Other Ambulatory Visit (HOSPITAL_COMMUNITY): Payer: BC Managed Care – PPO | Admitting: Licensed Clinical Social Worker

## 2017-06-10 DIAGNOSIS — F411 Generalized anxiety disorder: Secondary | ICD-10-CM

## 2017-06-10 DIAGNOSIS — F332 Major depressive disorder, recurrent severe without psychotic features: Secondary | ICD-10-CM

## 2017-06-10 DIAGNOSIS — R4589 Other symptoms and signs involving emotional state: Secondary | ICD-10-CM

## 2017-06-10 DIAGNOSIS — F0789 Other personality and behavioral disorders due to known physiological condition: Secondary | ICD-10-CM

## 2017-06-10 NOTE — Therapy (Signed)
[facility] [address] [place], [place], [address] Phone: [phone]   Fax:  [phone]  Occupational Therapy Treatment  Patient Details  Name: Sherry HeadsKayla Mercado MRN: [id number] Date of Birth: 02/10/1991 Referring Provider: Hillery Jacksanika Lewis, NP   Encounter Date: 06/10/2017  OT End of Session - 06/10/17 1451    Visit Number  3    Number of Visits  6    Date for OT Re-Evaluation  06/27/17    Authorization Type  [employer]    OT Start Time  1030    OT Stop Time  1130    OT Time Calculation (min)  60 min    Activity Tolerance  Patient tolerated treatment well    Behavior During Therapy  [facility] for tasks assessed/performed       Past Medical History:  Diagnosis Date  . Allergy    Zyrtec PRN  . Anxiety   . Depression     Past Surgical History:  Procedure Laterality Date  . EYE SURGERY     tear duct procedure    There were no vitals filed for this visit.  Subjective Assessment - 06/10/17 1451    Currently in Pain?  No/denies         [facility] - 06/10/17 1451      Assessment   Medical Diagnosis  GAD      Precautions   Precautions  None            OT Treatment Session: Time Management  S: "I have trouble keeping to a schedule, I'd like to set some goals though."   O: Time management session completed with emphasis on skills required, effective versus ineffective time management, importance of structure, along with tips and strategies for success. Patient provided with education on the effects of time management in regards to improving physical, emotional, and social well-being including improved ability for focus, decision-making skills, success in work, and social commitments, as well as benefits of reduced stress and the experience of greater success in all aspects of daily life.  A: Pt participated in time management occupational therapy treatment session this date. Pt participated in timed puzzle  activity working in pairs with focus on time management and looking at the "big picture" in correlation to a daily/weekly/monthly schedule being the "big picture" of planning and managing one's time. Pt actively engaged in worksheets and discussion identifying time wasters in their daily schedule and activities that could be incorporated when time wasters are eliminated. Pt provided with weekly outline to write goals and make a plan each day to work towards goals. Pt provided with handout outlining discussed strategies for improved time management.   P: Pt provided with time management strategies to implement during her daily schedule to assist in improving her balance between self-care, work, leisure, and PHP program tasks. OT will follow up with pt on success or struggles with implementation of learned strategies next session.               OT Short Term Goals - 06/05/17 2136      OT SHORT TERM GOAL #1   Title  Patient will be educated on strategies to improve psychosocial skills needed to participate fully in all daily, work, and leisure activities.    Time  3    Period  Weeks    Status  On-going      OT SHORT TERM GOAL #2   Title  Patient will be educated on  a HEP and independent with implementation of HEP.    Time  3    Period  Weeks    Status  On-going      OT SHORT TERM GOAL #3   Title  Patient will independently apply psychosocial skills and coping mechanisms to her daily activities in order to function independently.    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 PCOS (polycystic ovarian syndrome) 05/24/2015  . Anxiety state 09/18/2006  . ADHD 06/12/2006   Ezra Sites, OTR/L  [phone] 06/10/2017, 2:53 PM  [facility] PARTIAL HOSPITALIZATION PROGRAM [address] [place], [place], [address] Phone: [phone]   Fax:  [phone]  Name: Sherry Mercado MRN: [id number] Date of Birth: 1990-07-11

## 2017-06-11 ENCOUNTER — Encounter (HOSPITAL_COMMUNITY): Payer: Self-pay | Admitting: Family

## 2017-06-11 ENCOUNTER — Other Ambulatory Visit (HOSPITAL_COMMUNITY): Payer: BC Managed Care – PPO | Admitting: Licensed Clinical Social Worker

## 2017-06-11 DIAGNOSIS — F411 Generalized anxiety disorder: Secondary | ICD-10-CM

## 2017-06-11 NOTE — Psych (Signed)
[facility]  Sherry HeadsKayla Mercado [id number]  Session Time: 9:00 - 10:30  Participation Level: Active  Behavioral Response: CasualAlertAnxious  Type of Therapy: Group Therapy  Treatment Goals addressed: Coping  Interventions: CBT, DBT, Strength-based, Supportive and Reframing  Summary: Clinician led check-in regarding current stressors and situation, and review of patient completed daily inventory. Clinician utilized active listening and empathetic response and validated patient emotions. Clinician facilitated processing group on pertinent issues.   Therapist Response: Sherry HeadsKayla Mercado is a 27 y.o. female who presents with anxiety symptoms. Patient arrived within time allowed and reports that she is feeling "pretty good." Pt rates her mood at a 7 on a scale of 1-10 with 10 being great at the end of group. Pt reports she had a "really good weekend" and relaxed as well as had family time, ad spent time with friends. Pt reports some continued restlessness at night that is interfering with her sleep. Pt reports that she is still working on making self initiated changes. Pt engaged in discussion.       Session Time: 10:30 -11:30  Participation Level: Active  Behavioral Response: CasualAlertDepressed  Type of Therapy: Group Therapy, psychoeducation, psychotherapy  Treatment Goals addressed: Coping  Interventions: CBT, DBT, Solution Focused, Supportive and Reframing  Summary:  Clinician introduced topic of feelings and emotions. Group viewed feeling faces handout and identified which three emotions they feel right now and which they want to feel. Clinician discussed the way to contextualize feelings as things that come and go and the ability to choose which feelings we attach to. Cln used metaphor of leaves in the wind.    Therapist Response: Patient participated and shared the three feelings she wants to feel more of are: "confident, loved, peaceful." Pt  reports understanding of feelings and their purpose.     Session Time: 11:30 - 12:15   Participation Level: Active   Behavioral Response: CasualAlertDepressed   Type of Therapy: Group Therapy, Psychotherapy   Treatment Goals addressed: Coping   Interventions: CBT, Solution focused, Supportive, Reframing   Summary: Cln continued topic of feelings. Cln provided education on the difference between feelings and reactions to feelings, highlighting that our reactions we can alter.    Therapist Response: Patient participated and reports understanding of the role of feelings and the differences between feelings and our reactions to feelings.        Session Time: 12:15 - 1:00  Participation Level: Active  Behavioral Response: CasualAlertDepressed  Type of Therapy: Group Therapy, Activity Therapy  Treatment Goals addressed: Coping  Interventions: Psychologist, occupationalocial Skills Training, Supportive  Summary:  Reflection Group: Patients encouraged to practice skills and interpersonal techniques or work on mindfulness and relaxation techniques. The importance of self-care and making skills part of a routine to increase usage were stressed   Therapist Response: Patient engaged and participated appropriately.       Session Time: 12:45- 2:00  Participation Level: Active  Behavioral Response: CasualAlertDepressed  Type of Therapy: Group Therapy, Psychoeducation; Psychotherapy  Treatment Goals addressed: Coping  Interventions: CBT; Solution focused; Supportive; Reframing  Summary: 12:45 - 1:50: Clinician continued topic of feelings. Cln discussed 3 states of mind: emotion mind, reason mind, and wise mind. Pt's shared examples of when they were in each state of mind. Group played feelings Jenga and group members worked to access specificity with feeling recognition. 1:50 -2:00 Clinician led check-out. Clinician assessed for immediate needs, medication compliance and efficacy, and safety  concerns   Therapist Response: Patient engaged in  activity and discussion. Pt reports understanding of three states of mind. Pt is able to recognize and define a spectrum of feeling words.  At check-out, patient rates her mood at [facility] on a scale of 1-10 with 10 being great. Patient reports plans of hanging out with her dad and going on a walk.  Patient demonstrates some progress as evidenced by increased mood and brightness. Patient denies SI/HI/self-harm at the end of group.     Suicidal/Homicidal: Nowithout intent/plan   Plan: Pt will continue in PHP while working to decrease anxiety symptoms and increase ability to self manage those symptoms.   Diagnosis: GAD (generalized anxiety disorder) [F41.1]    1. GAD (generalized anxiety disorder)   2. Severe episode of recurrent major depressive disorder, without psychotic features (HCC)       [name], LCSW 06/11/2017

## 2017-06-11 NOTE — Progress Notes (Signed)
[facility] MD/PA/NP PHP Progress Note  06/11/2017 12:46 PM Sherry Mercado  MRN:  [id number]   Sherry Mercado seen attending group session, reported patient is active and engaged during session.  Report overall she is feeling a lot better.  Presents with a brighter affect during this assessment.  States  her mood has improved since her admission and reports she has benefiting from current discussions.  Reports she has now  taken the last hour before she goes to bed to "jot down" things that she learned during group sessions.  Reports her life is stressfree  at this moment so she has not had time to incorporate any skills that she is learned thus far. Sherry Mercado states she continues to step outside of her box as she is going on dates with her girlfriends.  Reports she is been attending choir practice. Sherry Mercado reports she has been very vocal during group, which is typically abnormal for her as is guarded and quiet by nature. Rates her depression 3 out of 10.  Reports her anxiety is managed very well with medications. Patient is prescribed Venlafaxine 75 mg and Vistaril 10 mg . Continues to deny that she is suicidal or homicidal.  Reports she is medication compliant without medication side effects.  Support, encouragement and reassurance was provided.    Visit Diagnosis:    ICD-10-CM   1. GAD (generalized anxiety disorder) F41.1     Past Psychiatric History:  Past Medical History:  Past Medical History:  Diagnosis Date  . Allergy    Zyrtec PRN  . Anxiety   . Depression     Past Surgical History:  Procedure Laterality Date  . EYE SURGERY     tear duct procedure    Family Psychiatric History:   Family History:  Family History  Problem Relation Age of Onset  . Hearing loss Mother   . Bipolar disorder Mother   . Depression Mother   . Anxiety disorder Mother   . Bipolar disorder Brother   . Anxiety disorder Brother   . Cancer Maternal Grandmother        breast cancer  . Bipolar disorder Maternal  Grandmother   . Depression Maternal Grandmother   . Hearing loss Maternal Grandfather   . Diabetes Father   . Anxiety disorder Father   . Deep vein thrombosis Father        After immobilization from surgery    Social History:  Social History   Socioeconomic History  . Marital status: Single    Spouse name: n/a  . Number of children: 0  . Years of education: college  . Highest education level: Not on file  Occupational History  . Occupation: singer  Social Needs  . Financial resource strain: Not on file  . Food insecurity:    Worry: Not on file    Inability: Not on file  . Transportation needs:    Medical: Not on file    Non-medical: Not on file  Tobacco Use  . Smoking status: Never Smoker  . Smokeless tobacco: Never Used  Substance and Sexual Activity  . Alcohol use: Yes    Alcohol/week: 0.0 oz    Comment: social  . Drug use: Yes    Types: Marijuana  . Sexual activity: Yes    Partners: Male    Birth control/protection: Condom  Lifestyle  . Physical activity:    Days per week: Not on file    Minutes per session: Not on file  . Stress: Not on file  Relationships  .  Social connections:    Talks on phone: Not on file    Gets together: Not on file    Attends religious service: Not on file    Active member of club or organization: Not on file    Attends meetings of clubs or organizations: Not on file    Relationship status: Not on file  Other Topics Concern  . Not on file  Social History Narrative   Marital status: single; not dating seriously.     Children: none      Lives: with dad in 2018; parents separated in 2018.  Brothers go back and forth (23, 21, 19).      Employment:  Consulting civil engineertudent      Education: [employer] 01/2015; voice music major.       Tobacco: none      Alcohol: socially      Drugs: none      Sexual activity: dates females and males.    Allergies: No Known Allergies  Metabolic Disorder Labs: Lab Results  Component Value Date    HGBA1C 4.9 10/30/2016   MPG 88 01/05/2016   MPG 108 03/28/2014   Lab Results  Component Value Date   PROLACTIN 7.8 03/28/2014   PROLACTIN 8.8 02/24/2012   Lab Results  Component Value Date   CHOL 147 10/30/2016   TRIG 126 10/30/2016   HDL 75 10/30/2016   CHOLHDL 2.0 10/30/2016   VLDL 19.122.0 08/29/2014   LDLCALC 47 10/30/2016   LDLCALC 62 08/29/2014   Lab Results  Component Value Date   TSH 2.030 10/30/2016   TSH 1.66 01/05/2016    Therapeutic Level Labs: No results found for: LITHIUM No results found for: VALPROATE No components found for:  CBMZ  Current Medications: Current Outpatient Medications  Medication Sig Dispense Refill  . hydrOXYzine (ATARAX/VISTARIL) 10 MG tablet Take 1 tab as needed for anxiety 30 tablet 0  . metFORMIN (GLUCOPHAGE-XR) 500 MG 24 hr tablet Take 4 tablets (2,000 mg total) by mouth daily with breakfast. 360 tablet 1  . venlafaxine XR (EFFEXOR-XR) 75 MG 24 hr capsule Take 1 capsule (75 mg total) by mouth daily with breakfast. Take two capsules (75mg ) daily by mouth 30 capsule 1   Current Facility-Administered Medications  Medication Dose Route Frequency Provider Last Rate Last Dose  . etonogestrel (NEXPLANON) implant 68 mg  68 mg Subdermal Once Myles LippsSantiago, Irma M, MD         Musculoskeletal: Strength & Muscle Tone: within normal limits Gait & Station: normal Patient leans: N/A  Psychiatric Specialty Exam: ROS  There were no vitals taken for this visit.There is no height or weight on file to calculate BMI.  General Appearance: Casual and Guarded  Eye Contact:  Good  Speech:  Clear and Coherent  Volume:  Normal  Mood:  Anxious  Affect:  Congruent and Flat  Thought Process:  Coherent  Orientation:  Full (Time, Place, and Person)  Thought Content: Hallucinations: None   Suicidal Thoughts:  No  Homicidal Thoughts:  No  Memory:  Immediate;   Fair Recent;   Fair Remote;   Fair  Judgement:  Fair  Insight:  Fair  Psychomotor Activity:   Normal  Concentration:  Concentration: Fair  Recall:  FiservFair  Fund of Knowledge: Fair  Language: Fair  Akathisia:  No  Handed:  Right  AIMS (if indicated):   Assets:  Communication Skills Desire for Improvement Resilience Social Support  ADL's:  Intact  Cognition: WNL  Sleep:  Fair  Screenings: GAD-7     Counselor from 06/03/2017 in BEHAVIORAL [facility]ALTH PARTIAL HOSPITALIZATION PROGRAM Office Visit from 02/25/2017 in Primary Care at [facility] Visit from 10/30/2016 in Primary Care at [facility] Visit from 01/05/2016 in Primary Care at [facility]  Total GAD-7 Score  11  14  1  6     PHQ2-9     Counselor from 06/03/2017 in BEHAVIORAL HEALTH PARTIAL HOSPITALIZATION PROGRAM Office Visit from 02/25/2017 in Primary Care at [facility] Visit from 12/25/2016 in Primary Care at [facility] Visit from 10/30/2016 in Primary Care at [facility] Visit from 03/21/2016 in Primary Care at [facility] Total Score  3  6  0  0  0  PHQ-9 Total Score  14  20  -  -  -       Assessment and Plan:   Continue PHP Continue current medications regimen  -Venlafaxine 75 mg and Hydroxyzine 10 mg PRN for GAD  Treatment plan reviewed and discussed by NP T. Melvyn Neth and  patient Sherry Mercado need for continued group services   Oneta Rack, NP 06/11/2017, 12:46 PM

## 2017-06-11 NOTE — Psych (Signed)
   [facility]  Sherry Mercado [id number]  Session Time: 9:00 - 10:45  Participation Level: Active  Behavioral Response: CasualAlertAnxious  Type of Therapy: Group Therapy  Treatment Goals addressed: Coping  Interventions: CBT, DBT, Strength-based, Supportive and Reframing  Summary: Clinician led check-in regarding current stressors and situation, and review of patient completed daily inventory. Clinician utilized active listening and empathetic response and validated patient emotions. Clinician facilitated processing group on pertinent issues.   Therapist Response: Sherry Mercado is a 27 y.o. female who presents with anxiety symptoms. Patient arrived within time allowed and reports she is feeling "happy that it is spring." Pt rates her mood at a 7 on a scale of 1-10 with 10 being great. Pt reports that she had an okay evening. Pt shared that she had a small meltdown because "I forgot I had choral society." Pt reports she is still working on limiting her co-dependency on others. Pt engaged in discussion.       Session Time: 10:45 -12:15  Participation Level: Active  Behavioral Response: CasualAlertAnxious  Type of Therapy: Group Therapy, psychotherapy  Treatment Goals addressed: Coping  Interventions: Strengths based, reframing, Supportive,   Summary:  Spiritual Care group  Therapist Response: Patient engaged in group. See chaplain note.        Session Time: 12:15 - 1:00  Participation Level: Active  Behavioral Response: CasualAlertAnxious  Type of Therapy: Group Therapy, Activity Therapy  Treatment Goals addressed: Coping  Interventions: Psychologist, occupationalocial Skills Training, Supportive  Summary:  Reflection Group: Patients encouraged to practice skills and interpersonal techniques or work on mindfulness and relaxation techniques. The importance of self-care and making skills part of a routine to increase usage were stressed   Therapist  Response: Patient engaged and participated appropriately.       Session Time: 1:00- 2:00  Participation Level: Active  Behavioral Response: CasualAlertDepressed  Type of Therapy: Group Therapy, Psychoeducation, Activity therapy  Treatment Goals addressed: Coping  Interventions: relaxation training; Supportive; Reframing  Summary: 12:45 - 1:50: Relaxation group: Cln led group focused on retraining the body's response to stress.   1:50 -2:00 Clinician led check-out. Clinician assessed for immediate needs, medication compliance and efficacy, and safety concerns   Therapist Response: Patient engaged activity and discussion. At check-out, patient rates her mood at a 6 on a scale of 1-10 with 10 being great. Pt reports that she has choir practice tonight after group. Pt demonstrates some progress as evidenced by making efforts to increase openness with her support system. Pt denies SI/HI/self-harm at the end of group.       Suicidal/Homicidal: Nowithout intent/plan   Plan: Pt will continue in PHP while working to decrease anxiety symptoms and increase ability to self manage those symptoms.   Diagnosis: GAD (generalized anxiety disorder) [F41.1]    1. GAD (generalized anxiety disorder)       Donia GuilesJenny Hope Holst, LCSW, LCAS 06/11/2017

## 2017-06-11 NOTE — Psych (Signed)
[facility] BH PHP THERAPIST PROGRESS NOTE  Annaleigha Woo [id number]  Session Time: 9:00 - 10:30  Participation Level: Active  Behavioral Response: CasualAlertAnxious  Type of Therapy: Group Therapy , Psychotherapy  Treatment Goals addressed: Coping  Interventions: CBT, DBT, Strength-based, Supportive and Reframing  Summary: Clinician led check-in regarding current stressors and situation, and review of patient completed daily inventory. Clinician utilized active listening and empathetic response and validated patient emotions. Clinician facilitated processing group on pertinent issues.   Therapist Response: Conchetta Lamia is a 27 y.o. female who presents with anxiety symptoms. Patient arrived within time allowed and reports that she is feeling "pretty good." Pt rates her mood at a 7 on a scale of 1-10 with 10 being great at the end of group. Pt reports she had a "really good weekend" and relaxed as well as had family time, ad spent time with friends. Pt reports some continued restlessness at night that is interfering with her sleep. Pt reports that she is still working on making self initiated changes. Pt engaged in discussion.     Session Time: 10:30 -11:30   Participation Level: Active   Behavioral Response: CasualAlertAnxious   Type of Therapy: Group Therapy, OT   Treatment Goals addressed: Coping   Interventions: Psychosocial skills training, Supportive,    Summary:  Occupational Therapy group   Therapist Response: Patient engaged in group. See OT note.             Session Time: 11:30 - 12:15   Participation Level: Active   Behavioral Response: CasualAlertAnxious   Type of Therapy: Group Therapy, Psychotherapy, Psychoeducation   Treatment Goals addressed: Coping   Interventions: CBT, Solution focused, Supportive, Reframing   Summary:  Clinician introduced topic of healthy relationships.  Group discussed healthy/unhealthy traits for relationships. Group  discussed the 3 key components of a healthy relationship: trust, respect, honesty; and the 2 "silent" extras: communication and boundaries.    Therapist Response: Pt engaged in activity and discussion. Pt reports she feels like she has the key components with most of her important relationships, but feels she needs to continue to work on communication and boundaries.         Session Time: 12:15 - 1:00   Participation Level: Active   Behavioral Response: CasualAlertAnxious   Type of Therapy: Group Therapy, Activity Therapy   Treatment Goals addressed: Coping   Interventions: Psychologist, occupational, Supportive   Summary:  Reflection Group: Patients encouraged to practice skills and interpersonal techniques or work on mindfulness and relaxation techniques. The importance of self-care and making skills part of a routine to increase usage were stressed    Therapist Response: Patient engaged and participated appropriately.            Session Time: 1:00- 2:00   Participation Level: Active   Behavioral Response: CasualAlertAnxious   Type of Therapy: Group Therapy, Psychoeducation; Psychotherapy   Treatment Goals addressed: Coping   Interventions: CBT; Solution focused; Supportive; Reframing   Summary: 1:00 - 1:50: Clinician continued with relationship topic.  Patients took [employer] and discussed how love languages can affect relationships.  Clinician continued with topic of communication within relationships. Clinician led "back to back communication" exercise to introduce problems with perception and communication and how that affects relationships. Pt's sat back to back from a partner and attempted to have their partner draw a picture only the patient can see. 1:50 -2:00 Clinician led check-out. Clinician assessed for immediate needs, medication compliance and efficacy, and safety concerns  Therapist Response: Pt engaged in activity and discussion. Pt shares  her primary love language is "quality time" which did not surprise her, and secondary is "words of affirmations" which was surprising. Pt reports she scored a "0" for physical touch which is not surprising.  Pt reports she is interested to share this with her boyfriend because she feels "he should just know I want to spend quality time with him, but I guess he's not a mind-reader."  Pt reports this will help her with nurturing her important relationships.  Pt reports she learned a lot from the back-to-back activity, mainly to be descriptive, ask questions, and be open to other's interpretations of things. At check-out, patient rates her mood at a 6.5 on a scale of 1-10 with 10 being great. Pt reports she is going to [employer] practice and wants to work on sending her professor an Agricultural engineeremail to schedule a Public house managervoice lesson.  Group provided support and encouragement for writing the email.  Pt demonstrates some progress as evidenced by increased willingness to participate in prosocial activities outside of group. Pt denies SI/HI/self-harm at the end of group.   Suicidal/Homicidal: Nowithout intent/plan   Plan: Pt will continue in PHP while working to decrease anxiety symptoms and increase ability to self manage those symptoms.   Diagnosis: GAD (generalized anxiety disorder) [F41.1]    1. GAD (generalized anxiety disorder)   2. Severe episode of recurrent major depressive disorder, without psychotic features (HCC)       Quinn AxeWhitney J Hayk Divis, LPCA 06/11/2017

## 2017-06-12 ENCOUNTER — Other Ambulatory Visit (HOSPITAL_COMMUNITY): Payer: BC Managed Care – PPO

## 2017-06-12 ENCOUNTER — Other Ambulatory Visit (HOSPITAL_COMMUNITY): Payer: BC Managed Care – PPO | Admitting: Licensed Clinical Social Worker

## 2017-06-12 DIAGNOSIS — F411 Generalized anxiety disorder: Secondary | ICD-10-CM

## 2017-06-13 ENCOUNTER — Encounter (HOSPITAL_COMMUNITY): Payer: Self-pay

## 2017-06-13 ENCOUNTER — Other Ambulatory Visit (HOSPITAL_COMMUNITY): Payer: BC Managed Care – PPO | Admitting: Licensed Clinical Social Worker

## 2017-06-13 VITALS — BP 118/74 | HR 95 | Ht 65.0 in | Wt 223.0 lb

## 2017-06-13 DIAGNOSIS — F411 Generalized anxiety disorder: Secondary | ICD-10-CM

## 2017-06-13 DIAGNOSIS — F332 Major depressive disorder, recurrent severe without psychotic features: Secondary | ICD-10-CM

## 2017-06-13 NOTE — Progress Notes (Signed)
Patient presented with flat affect, depressed mood and admitted to not feeling so well today due to some reported congestion.  Patient denied any suicidal or homicidal ideations, no auditory or visual hallucinations and reported overall feeling better with current medication regimen.  Patient's 02 at 98% as she stated her boyfriend and her both were a little congested but okay.  Patient rated her current level of depression a 4, anxiety a 2 and hopelessness a 2 on a scale of 0-10 with 0 being none and 10 the worst she could manage.  Patient stated feeling PHP had been very helpful for learning skills to better manage anxiety and reported while she use to cut when was anxious had not done this in several years.  Patient scored an 11 on her PHQ9 depression screening, down from 14 from 06/02/17 and reported feeling anxiety better managed with lower dosage of Hydroxyzine which now does not make her feel too sedated.  Patient discussed at times she feels she is forgetful and while she sleeps 7-9 hours a night never feels rested and thinks perhaps she needs a sleep study to rule out sleep apnea.  Agreed to discuss this with Hillery Jacksanika Lewis, NP and patient could be referred for consult if felt necessary.  Patient denied any problem with appetite or any other issues at this time.  States she plans to follow up with Dr. Lolly MustacheArfeen and individual therapy once completes PHP.  Patient agreed to contact this nurse if any problems with medications or worsening of symptoms.

## 2017-06-16 ENCOUNTER — Other Ambulatory Visit (HOSPITAL_COMMUNITY): Payer: Self-pay | Admitting: Psychiatry

## 2017-06-16 ENCOUNTER — Encounter (HOSPITAL_COMMUNITY): Payer: Self-pay | Admitting: Occupational Therapy

## 2017-06-16 ENCOUNTER — Other Ambulatory Visit: Payer: Self-pay

## 2017-06-16 ENCOUNTER — Other Ambulatory Visit (HOSPITAL_COMMUNITY): Payer: BC Managed Care – PPO | Admitting: Occupational Therapy

## 2017-06-16 ENCOUNTER — Ambulatory Visit (INDEPENDENT_AMBULATORY_CARE_PROVIDER_SITE_OTHER): Payer: BC Managed Care – PPO

## 2017-06-16 ENCOUNTER — Encounter: Payer: Self-pay | Admitting: Physician Assistant

## 2017-06-16 ENCOUNTER — Ambulatory Visit: Payer: BC Managed Care – PPO | Admitting: Physician Assistant

## 2017-06-16 ENCOUNTER — Other Ambulatory Visit (HOSPITAL_COMMUNITY): Payer: BC Managed Care – PPO | Admitting: Licensed Clinical Social Worker

## 2017-06-16 VITALS — BP 128/80 | HR 100 | Temp 100.0°F | Resp 18 | Ht 66.14 in | Wt 217.4 lb

## 2017-06-16 DIAGNOSIS — R4589 Other symptoms and signs involving emotional state: Secondary | ICD-10-CM

## 2017-06-16 DIAGNOSIS — F411 Generalized anxiety disorder: Secondary | ICD-10-CM

## 2017-06-16 DIAGNOSIS — J020 Streptococcal pharyngitis: Secondary | ICD-10-CM | POA: Diagnosis not present

## 2017-06-16 DIAGNOSIS — F0789 Other personality and behavioral disorders due to known physiological condition: Secondary | ICD-10-CM

## 2017-06-16 DIAGNOSIS — J029 Acute pharyngitis, unspecified: Secondary | ICD-10-CM | POA: Diagnosis not present

## 2017-06-16 DIAGNOSIS — F332 Major depressive disorder, recurrent severe without psychotic features: Secondary | ICD-10-CM

## 2017-06-16 DIAGNOSIS — J3489 Other specified disorders of nose and nasal sinuses: Secondary | ICD-10-CM

## 2017-06-16 DIAGNOSIS — R059 Cough, unspecified: Secondary | ICD-10-CM

## 2017-06-16 DIAGNOSIS — R05 Cough: Secondary | ICD-10-CM

## 2017-06-16 LAB — POCT CBC
GRANULOCYTE PERCENT: 63.8 % (ref 37–80)
HCT, POC: 45.5 % (ref 37.7–47.9)
HEMOGLOBIN: 14.8 g/dL (ref 12.2–16.2)
Lymph, poc: 2 (ref 0.6–3.4)
MCH: 28.8 pg (ref 27–31.2)
MCHC: 32.5 g/dL (ref 31.8–35.4)
MCV: 88.6 fL (ref 80–97)
MID (cbc): 0.9 (ref 0–0.9)
MPV: 7.1 fL (ref 0–99.8)
POC Granulocyte: 5.1 (ref 2–6.9)
POC LYMPH PERCENT: 24.7 %L (ref 10–50)
POC MID %: 11.5 %M (ref 0–12)
Platelet Count, POC: 389 10*3/uL (ref 142–424)
RBC: 5.14 M/uL (ref 4.04–5.48)
RDW, POC: 13 %
WBC: 8 10*3/uL (ref 4.6–10.2)

## 2017-06-16 LAB — POCT RAPID STREP A (OFFICE): Rapid Strep A Screen: POSITIVE — AB

## 2017-06-16 LAB — POC INFLUENZA A&B (BINAX/QUICKVUE)
Influenza A, POC: NEGATIVE
Influenza B, POC: NEGATIVE

## 2017-06-16 MED ORDER — IPRATROPIUM BROMIDE 0.03 % NA SOLN: 2.0000 | Freq: Two times a day (BID) | NASAL | 0 refills | Status: AC

## 2017-06-16 MED ORDER — BENZONATATE 100 MG PO CAPS: 100.0000 mg | ORAL_CAPSULE | Freq: Three times a day (TID) | ORAL | 0 refills | Status: DC | PRN

## 2017-06-16 MED ORDER — AMOXICILLIN 500 MG PO CAPS: 500.0000 mg | ORAL_CAPSULE | Freq: Two times a day (BID) | ORAL | 0 refills | Status: DC

## 2017-06-16 NOTE — Therapy (Signed)
[facility] PARTIAL HOSPITALIZATION PROGRAM [address] [place], [place], [address] Phone: [phone]   Fax:  [phone]  Occupational Therapy Treatment  Patient Details  Name: Sherry Mercado MRN: [id number] Date of Birth: Mar 17, 1990 Referring Provider: Hillery Jacks, NP   Encounter Date: 06/16/2017  OT End of Session - 06/16/17 1432    Visit Number  4    Number of Visits  6    Date for OT Re-Evaluation  06/27/17    Authorization Type  [employer]    OT Start Time  1115    OT Stop Time  1230    OT Time Calculation (min)  75 min    Activity Tolerance  Patient tolerated treatment well    Behavior During Therapy  [facility] for tasks assessed/performed       Past Medical History:  Diagnosis Date  . Allergy    Zyrtec PRN  . Anxiety   . Depression   . PCOS (polycystic ovarian syndrome) 2016    Past Surgical History:  Procedure Laterality Date  . EYE SURGERY     tear duct procedure  . WISDOM TOOTH EXTRACTION Bilateral 05/2017    There were no vitals filed for this visit.  Subjective Assessment - 06/16/17 1431    Currently in Pain?  No/denies           S: "I feel like I go along with what my conductor says too easily". O: Communication skills group completed with emphasis on control, influence and acceptance in regards to social scenarios. Team building obstacle course completed to improve communication and listening skills, and to build trust between partners. The control, influence, acceptance (CIA) framework was provided as a communication skill to help address uncomfortable or "elephant in the room" social situations. Pt was to write out 2-3 uncomfortable social situations to analyze within group. Education and handout given to address importance of recognizing aspects of control and areas not possible to control in relation to daily life. This exercise offered an opportunity for self-reflection in order to relieve stress and uncertainty when using  appropriate communication skills/strategies. A: Pt actively engaged in Insurance underwriter worksheets this date. Pt identified 2 uncomfortable social situations with focus on romantic relationships and professional relationships. Pt was able to accurately identify areas of control vs. areas not within personal control after presented with CIA framework. Pt actively engaged in self-control based handouts to further identify aspects of control in daily life.  Pt initiated conversation to offer example within group this date. P: Pt provided with communication skills in the area of the CIA framework to implement into her daily social situations to offer self-improvement and improve relationships. OT will continue follow up with communication skills for successful implementation in daily life.                 OT Education - 06/16/17 1431    Education provided  Yes    Education Details  educated on communication skills with emphasis on self control in social situations    Person(s) Educated  Patient    Methods  Explanation;Demonstration;Handout    Comprehension  Verbalized understanding;Returned demonstration       OT Short Term Goals - 06/05/17 2136      OT SHORT TERM GOAL #1   Title  Patient will be educated on strategies to improve psychosocial skills needed to participate fully in all daily, work, and leisure activities.    Time  3    Period  Weeks  Status  On-going      OT SHORT TERM GOAL #2   Title  Patient will be educated on a HEP and independent with implementation of HEP.    Time  3    Period  Weeks    Status  On-going      OT SHORT TERM GOAL #3   Title  Patient will independently apply psychosocial skills and coping mechanisms to her daily activities in order to function independently.    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 PCOS (polycystic ovarian syndrome) 05/24/2015  . Anxiety state 09/18/2006  . ADHD 06/12/2006   Dalphine Handing, MSOT, OTR/L  [place] 06/16/2017, 2:39 PM  [facility] PARTIAL HOSPITALIZATION PROGRAM [address] [place], [place], [address] Phone: [phone]   Fax:  [phone]  Name: Sherry Mercado MRN: [id number] Date of Birth: 1990/10/23

## 2017-06-16 NOTE — Patient Instructions (Addendum)
  You tested positive for strep. I have sent a prescription for the antibiotic to your pharmacy. Please take as prescribed. I have also given you something to help cough and to help with runny nose. Please follow up as needed. Thank you for letting me participate in your health and well being.   Strep Throat Strep throat is an infection of the throat. It is caused by germs. Strep throat spreads from person to person because of coughing, sneezing, or close contact. Follow these instructions at home: Medicines  Take over-the-counter and prescription medicines only as told by your doctor.  Take your antibiotic medicine as told by your doctor. Do not stop taking the medicine even if you feel better.  Have family members who also have a sore throat or fever go to a doctor. Eating and drinking  Do not share food, drinking cups, or personal items.  Try eating soft foods until your sore throat feels better.  Drink enough fluid to keep your pee (urine) clear or pale yellow. General instructions  Rinse your mouth (gargle) with a salt-water mixture 3-4 times per day or as needed. To make a salt-water mixture, stir -1 tsp of salt into 1 cup of warm water.  Make sure that all people in your house wash their hands well.  Rest.  Stay home from school or work until you have been taking antibiotics for 24 hours.  Keep all follow-up visits as told by your doctor. This is important. Contact a doctor if:  Your neck keeps getting bigger.  You get a rash, cough, or earache.  You cough up thick liquid that is green, yellow-brown, or bloody.  You have pain that does not get better with medicine.  Your problems get worse instead of getting better.  You have a fever. Get help right away if:  You throw up (vomit).  You get a very bad headache.  You neck hurts or it feels stiff.  You have chest pain or you are short of breath.  You have drooling, very bad throat pain, or changes in your  voice.  Your neck is swollen or the skin gets red and tender.  Your mouth is dry or you are peeing less than normal.  You keep feeling more tired or it is hard to wake up.  Your joints are red or they hurt. This information is not intended to replace advice given to you by your health care provider. Make sure you discuss any questions you have with your health care provider. Document Released: 07/24/2007 Document Revised: 10/04/2015 Document Reviewed: 05/30/2014 Elsevier Interactive Patient Education  2018 [employer].     IF you received an x-ray today, you will receive an invoice from [facility]. Please contact [facility] at [phone] with questions or concerns regarding your invoice.   IF you received labwork today, you will receive an invoice from [facility]. Please contact [facility] at [phone] with questions or concerns regarding your invoice.   Our billing staff will not be able to assist you with questions regarding bills from these companies.  You will be contacted with the lab results as soon as they are available. The fastest way to get your results is to activate your My Chart account. Instructions are located on the last page of this paperwork. If you have not heard from [place] regarding the results in 2 weeks, please contact this office.

## 2017-06-16 NOTE — Progress Notes (Signed)
MRN: [id number] DOB: [birth year]  Subjective:   Sherry Mercado is a 27 y.o. female presenting for chief complaint of Cough (X 5 days) and Nasal Congestion (X 5 days) .  Reports 5 day history of worsening productive cough, fever, chest congestion, head congestion, sore throat, sinus pain, headache, and generalized body aches.  Has tried dayquil and nyquil with some relief. Denies  ear pain, wheezing, shortness of breath, chest pain, nausea, vomiting, abdominal pain and diarrhea. Has not had sick contact with anyone. No history of seasonal allergies, no history of asthma. Patient has not had flu shot this season. Denies smoking. Denies any other aggravating or relieving factors, no other questions or concerns.  ROS per HPI  Sherry Mercado has a current medication list which includes the following prescription(s): hydroxyzine, metformin, and venlafaxine xr, and the following Facility-Administered Medications: etonogestrel. Also has No Known Allergies.  Sherry Mercado  has a past medical history of Allergy, Anxiety, Depression, and PCOS (polycystic ovarian syndrome) (2016). Also  has a past surgical history that includes Eye surgery and Wisdom tooth extraction (Bilateral, 05/2017).   Social History   Socioeconomic History  . Marital status: Single    Spouse name: n/a  . Number of children: 0  . Years of education: college  . Highest education level: Not on file  Occupational History  . Occupation: singer  Social Needs  . Financial resource strain: Not very hard  . Food insecurity:    Worry: Never true    Inability: Never true  . Transportation needs:    Medical: No    Non-medical: Yes  Tobacco Use  . Smoking status: Never Smoker  . Smokeless tobacco: Never Used  Substance and Sexual Activity  . Alcohol use: Yes    Alcohol/week: 0.0 oz    Comment: social  . Drug use: Yes    Frequency: 2.0 times per week    Types: Marijuana  . Sexual activity: Yes    Partners: Male    Birth  control/protection: Implant  Lifestyle  . Physical activity:    Days per week: 0 days    Minutes per session: 0 min  . Stress: Only a little  Relationships  . Social connections:    Talks on phone: Never    Gets together: Twice a week    Attends religious service: More than 4 times per year    Active member of club or organization: Yes    Attends meetings of clubs or organizations: More than 4 times per year    Relationship status: Never married  . Intimate partner violence:    Fear of current or ex partner: No    Emotionally abused: No    Physically abused: No    Forced sexual activity: No  Other Topics Concern  . Not on file  Social History Narrative   Marital status: single; not dating seriously.     Children: none      Lives: with dad in 2018; parents separated in 2018.  Brothers go back and forth (23, 21, 19).      Employment:  Consulting civil engineer      Education: [employer] 01/2015; voice music major.       Tobacco: none      Alcohol: socially      Drugs: none      Sexual activity: dates females and males.      Objective:   Vitals: BP 128/80 (BP Location: Left Arm, Patient Position: Sitting, Cuff Size: Normal)   Pulse 100  Temp 100 F (37.8 C) (Oral)   Resp 18   Ht 5' 6.14" (1.68 m)   Wt 217 lb 6.4 oz (98.6 kg)   SpO2 98%   BMI 34.94 kg/m   Physical Exam  Constitutional: She is oriented to person, place, and time. She appears well-developed and well-nourished. No distress.  HENT:  Head: Normocephalic and atraumatic.  Right Ear: Tympanic membrane, external ear and ear canal normal.  Left Ear: Tympanic membrane, external ear and ear canal normal.  Nose: No mucosal edema. Right sinus exhibits no maxillary sinus tenderness and no frontal sinus tenderness. Left sinus exhibits no maxillary sinus tenderness and no frontal sinus tenderness.  Mouth/Throat: Uvula is midline and mucous membranes are normal. Posterior oropharyngeal erythema present. No posterior  oropharyngeal edema or tonsillar abscesses. Tonsils are 2+ on the right. No tonsillar exudate.  Tonsils are erythematous bilaterally. No exudates.   Eyes: Conjunctivae are normal.  Neck: Normal range of motion.  Cardiovascular: Normal rate, regular rhythm, normal heart sounds and intact distal pulses.  Pulmonary/Chest: Effort normal. No accessory muscle usage. No respiratory distress. She has no decreased breath sounds. She has no wheezes. She has rhonchi (cleared with cough) in the right middle field. She has no rales.  Lymphadenopathy:       Head (right side): No submental, no submandibular, no tonsillar, no preauricular, no posterior auricular and no occipital adenopathy present.       Head (left side): No submental, no submandibular, no tonsillar, no preauricular, no posterior auricular and no occipital adenopathy present.    She has no cervical adenopathy.       Right: No supraclavicular adenopathy present.       Left: No supraclavicular adenopathy present.  Neurological: She is alert and oriented to person, place, and time.  Skin: Skin is warm and dry.  Psychiatric: She has a normal mood and affect.  Vitals reviewed.   Results for orders placed or performed in visit on 06/16/17 (from the past 24 hour(s))  POCT CBC     Status: Normal   Collection Time: 06/16/17  6:14 PM  Result Value Ref Range   WBC 8.0 4.6 - 10.2 K/uL   Lymph, poc 2.0 0.6 - 3.4   POC LYMPH PERCENT 24.7 10 - 50 %L   MID (cbc) 0.9 0 - 0.9   POC MID % 11.5 0 - 12 %M   POC Granulocyte 5.1 2 - 6.9   Granulocyte percent 63.8 37 - 80 %G   RBC 5.14 4.04 - 5.48 M/uL   Hemoglobin 14.8 12.2 - 16.2 g/dL   HCT, POC 09.8 11.9 - 47.9 %   MCV 88.6 80 - 97 fL   MCH, POC 28.8 27 - 31.2 pg   MCHC 32.5 31.8 - 35.4 g/dL   RDW, POC 14.7 %   Platelet Count, POC 389 142 - 424 K/uL   MPV 7.1 0 - 99.8 fL  POCT rapid strep A     Status: Abnormal   Collection Time: 06/16/17  6:23 PM  Result Value Ref Range   Rapid Strep A Screen  Positive (A) Negative  POC Influenza A&B(BINAX/QUICKVUE)     Status: None   Collection Time: 06/16/17  6:23 PM  Result Value Ref Range   Influenza A, POC Negative Negative   Influenza B, POC Negative Negative    Dg Chest 2 View  Result Date: 06/16/2017 CLINICAL DATA:  27 y/o  F; 5 days of productive cough with fever. EXAM: CHEST - 2  VIEW COMPARISON:  02/24/2012 chest radiograph. FINDINGS: Stable heart size and mediastinal contours are within normal limits. Both lungs are clear. The visualized skeletal structures are unremarkable. IMPRESSION: No active cardiopulmonary disease. Electronically Signed   By: Mitzi Hansen M.D.   On: 06/16/2017 18:35    Assessment and Plan :  1. Streptococcal sore throat POC strep test positive. Rx for amoxil. Recommend OTC ibuprofen as prescribed. Follow up as needed. - amoxicillin (AMOXIL) 500 MG capsule; Take 1 capsule (500 mg total) by mouth 2 (two) times daily.  Dispense: 20 capsule; Refill: 0  2. Cough CXR with no acute cardiopulmonary disease. Given Sx tx. Follow up as needed. - POCT CBC - POC Influenza A&B(BINAX/QUICKVUE) - DG Chest 2 View; Future - benzonatate (TESSALON) 100 MG capsule; Take 1-2 capsules (100-200 mg total) by mouth 3 (three) times daily as needed for cough.  Dispense: 40 capsule; Refill: 0  3. Sore throat - POCT rapid strep A  4. Rhinorrhea - ipratropium (ATROVENT) 0.03 % nasal spray; Place 2 sprays into both nostrils 2 (two) times daily.  Dispense: 30 mL; Refill: 0  Benjiman Core, PA-C  [facility] 06/16/2017 6:40 PM

## 2017-06-16 NOTE — Psych (Signed)
[facility] PHP THERAPIST PROGRESS NOTE  Sherry Mercado [id number]  Session Time: 9:00 - 10:30  Participation Level: Active  Behavioral Response: CasualAlertAnxious  Type of Therapy: Group Therapy , Psychotherapy  Treatment Goals addressed: Coping  Interventions: CBT, DBT, Strength-based, Supportive and Reframing  Summary: Clinician led check-in regarding current stressors and situation, and review of patient completed daily inventory. Clinician utilized active listening and empathetic response and validated patient emotions. Clinician facilitated processing group on pertinent issues.   Therapist Response: Annabel Gibeau is a 26 y.o. female who presents with anxiety symptoms. Pt arrived within time allowed and reports she is feeling "sick, my throat his hurting this morning." Pt rates her mood at a 4 on a scale of 1-10 with 10 being great. Pt reports that she did not go to church choir last night because it was canceled. Pt shared that she read for a while last night to distract. Pt reports that she is still working on limiting her co-dependency and finding motivation to drive again. Pt engaged in discussion.        Session Time: 10:30 -12:00   Participation Level: Active   Behavioral Response: CasualAlertDepressed   Type of Therapy: Group Therapy, OT   Treatment Goals addressed: Coping   Interventions: Psychosocial skills training, Supportive,    Summary:  Occupational Therapy group: Cln led group on life after group concerning careers, schooling, and volunteering. Cln had patients complete a "bucket filler" where pts had to write out positive affirmations about each group member or individuals outside of group and then hand them out.     Therapist Response: Pt engaged in activity and discussion. 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 Pt shared that she would also like to become a dog walker after  group.             Session Time: 12:15 - 1:00  Participation Level: Active  Behavioral Response: CasualAlertDepressed  Type of Therapy: Group Therapy, Activity Therapy  Treatment Goals addressed: Coping  Interventions: Psychologist, occupational, Supportive  Summary:  Reflection Group: Patients encouraged to practice skills and interpersonal techniques or work on mindfulness and relaxation techniques. The importance of self-care and making skills part of a routine to increase usage were stressed   Therapist Response: Patient engaged and participated appropriately.       Session Time: 12:45- 2:00  Participation Level: Active  Behavioral Response: CasualAlertDepressed  Type of Therapy: Group Therapy, Psychoeducation; Psychotherapy  Treatment Goals addressed: Coping  Interventions: CBT; Solution focused; Supportive; Reframing  Summary: 12:45 - 1:50: Cln introduced the topic of mindfulness. Cln went over the "What" and "How" mindfulness worksheets. Pts reviewed and practice how to do mindfulness meditation and body scan with cln.  1:50 -2:00 Clinician led check-out. Clinician assessed for immediate needs, medication compliance and efficacy, and safety concerns   Therapist Response: Pt engaged in activity and discussion. Pt shared that she likes the overall goal of mindfulness and is going to start implementing the meditation skill into her daily routine.  At check-out, patient rates her mood at a 5 on a scale of 1-10 with 10 being great. Pt reported that she is going to rest for the remaining of the day and get some cold medicine to help her throat. Pt demonstrates some progress as evidenced by becoming more open-minded about driving again. Pt denies Si/HI/self-harm at the end of group.  Suicidal/Homicidal: Nowithout intent/plan   Plan: Pt will continue in PHP while working to decrease anxiety symptoms and increase ability to self manage those symptoms.    Diagnosis: GAD (generalized anxiety disorder) [F41.1]    1. GAD (generalized anxiety disorder)       [name], LCSW 06/16/2017

## 2017-06-17 ENCOUNTER — Other Ambulatory Visit (HOSPITAL_COMMUNITY): Payer: BC Managed Care – PPO | Admitting: Licensed Clinical Social Worker

## 2017-06-17 ENCOUNTER — Encounter (HOSPITAL_COMMUNITY): Payer: Self-pay | Admitting: Occupational Therapy

## 2017-06-17 ENCOUNTER — Other Ambulatory Visit (HOSPITAL_COMMUNITY): Payer: BC Managed Care – PPO | Admitting: Occupational Therapy

## 2017-06-17 DIAGNOSIS — F0789 Other personality and behavioral disorders due to known physiological condition: Secondary | ICD-10-CM

## 2017-06-17 DIAGNOSIS — F411 Generalized anxiety disorder: Secondary | ICD-10-CM

## 2017-06-17 DIAGNOSIS — F332 Major depressive disorder, recurrent severe without psychotic features: Secondary | ICD-10-CM

## 2017-06-17 DIAGNOSIS — R4589 Other symptoms and signs involving emotional state: Secondary | ICD-10-CM

## 2017-06-17 NOTE — Psych (Signed)
[facility] PHP THERAPIST PROGRESS NOTE  Sherry Mercado [id number]  Session Time: 9:00 - 10:15  Participation Level: Active  Behavioral Response: CasualAlertAnxious  Type of Therapy: Group Therapy , Psychotherapy  Treatment Goals addressed: Coping  Interventions: CBT, DBT, Strength-based, Supportive and Reframing  Summary: Clinician led check-in regarding current stressors and situation, and review of patient completed daily inventory. Clinician utilized active listening and empathetic response and validated patient emotions. Clinician facilitated processing group on pertinent issues.   Therapist Response: Ali Mclaurin is a 27 y.o. female who presents with anxiety symptoms. Pt arrived within time allowed and reports that she is feeling "sick, my throat is hurting and I am coughing a lot." Pt rates her mood at a 4 on a scale of 1-10 with 10 being great. Pt reports that she did not have an eventful evening. Pt shared she relaxed to try to feel better. Pt reports she is still working on developing a routine she can follow daily. Pt engaged in discussion.        Session Time: 10:15 -11:15   Participation Level: Active   Behavioral Response: CasualAlertDepressed   Type of Therapy: Group Therapy, OT   Treatment Goals addressed: Coping   Interventions: DBT; CBT; Solution focused; Supportive; Reframing    Summary:  Cln continued reviewing mindfulness exercises with patients. Pts practiced mindfully eating and the 5 senses skills with cln.      Therapist Response: Pt engaged in activity and discussion. Pt reports that she does not think about mindfully eating but is willing to try it. Pt shared that she is going to use the 5 senses skill when she is out in public, if she feels herself getting overwhelmed.            Session Time: 11:15 - 12:00  Participation Level: Active  Behavioral Response: CasualAlertDepressed  Type of Therapy: Group Therapy, Activity  Therapy  Treatment Goals addressed: Coping  Interventions: DBT, Supportive  Summary:  Cln had pts work on different mindfulness activities including coloring, a work Financial controller, sudoku puzzle, and a maze. Pts identified which activities allowed to concentrate and be mindful the best.   Therapist Response: Pt engaged in activity and discussion. Pt reported that she enjoyed the maze as it challenged her. Pt shared while doing the maze, she did not find her mind wandering.         Session Time: 12:00- 1:00  Participation Level: Active  Behavioral Response: CasualAlertDepressed  Type of Therapy: Group Therapy, Psychoeducation; Psychotherapy  Treatment Goals addressed: Coping  Interventions: DBT; CBT; Solution focused; Supportive; Reframing  Summary: 12:00 - 12:50: Pts watched "Don't Be Mindful" TEd[employer] and discussed the likes and dislikes they have with being mindful.  12:50 -1:00 Clinician led check-out. Clinician assessed for immediate needs, medication compliance and efficacy, and safety concerns   Therapist Response: Pt engaged in activity and discussion. Pt reported that she realized that mindfulness is easier than she thought.  At check-out, patient rates her mood at a 5 on a scale of 1-10 with 10 being great. Pt reports that she is going to relax over the weekend and does not have much planned. Pt demonstrates some progress by identifying ways she is going to incorporate mindfulness into her routine over the weekend. Pt plans to mindfully eat. Pt denies SI/HI/self-harm at the end of group.      Suicidal/Homicidal: Nowithout intent/plan   Plan: Pt will continue in PHP while working to decrease anxiety symptoms and increase ability to self manage  those symptoms.   Diagnosis: GAD (generalized anxiety disorder) [F41.1]    1. GAD (generalized anxiety disorder)   2. Severe episode of recurrent major depressive disorder, without psychotic features (HCC)       [name],  LCSW 06/17/2017

## 2017-06-17 NOTE — Therapy (Signed)
[facility] PARTIAL HOSPITALIZATION PROGRAM [address] [place], [place], [address] Phone: [phone]   Fax:  [phone]  Occupational Therapy Treatment  Patient Details  Name: Sherry Mercado MRN: [id number] Date of Birth: 07/27/1990 Referring Provider: Hillery Jacks, NP   Encounter Date: 06/17/2017  OT End of Session - 06/17/17 1323    Visit Number  5    Number of Visits  6    Date for OT Re-Evaluation  06/27/17    Authorization Type  [employer]    OT Start Time  1130    OT Stop Time  1230    OT Time Calculation (min)  60 min    Activity Tolerance  Patient tolerated treatment well    Behavior During Therapy  [facility] for tasks assessed/performed       Past Medical History:  Diagnosis Date  . Allergy    Zyrtec PRN  . Anxiety   . Depression   . PCOS (polycystic ovarian syndrome) 2016    Past Surgical History:  Procedure Laterality Date  . EYE SURGERY     tear duct procedure  . WISDOM TOOTH EXTRACTION Bilateral 05/2017    There were no vitals filed for this visit.  Subjective Assessment - 06/17/17 1322    Subjective   --    Currently in Pain?  No/denies           S: "I want to be able to assertively express my feelings to my boyfriend". O:  Education and activities given in reference to increase assertiveness skills within daily life and relationships. Assertiveness quiz given to increase insight on pt current skills and how to improve based on given score. Further education given on assertive conversation, situations, body language, and appropriate context for skill. Worksheet given to identify three definitions (assertive, passive, and aggressive) with opportunity for role play between 2 participants in to promote assertiveness training in a variety of social settings (individuals in community and health care providers). Pt asked to identify one area to increase assertiveness this date. Further education given on the importance of  assertiveness mentors and online research to continue skill building in this area increase quality of life.  A: Pt was actively engaged throughout entirety of group this date. Pt completed assertiveness quiz showing she is naturally passive and needs to actively work on assertiveness skills- pt in agreeance with this score. Pt expressed reflective worksheets/quiz helped gain insight into her tendency to say "yes" to everything and let others choose for her. Pt actively participated in role playing activity to practice being assertive to a random community member and being an aggressive healthcare provider. Overall, pt shows need to continue building assertiveness skills. In reference to last activity, identified goal of increasing assertiveness in expressing feelings and opinions to boyfriend. P: Pt provided with assertiveness skills to implement into a variety of daily social situations. OT will continue to follow up with communication skills for successful implementation into daily life.                 OT Education - 06/17/17 1322    Education provided  Yes    Education Details  educated on assertiveness skills in relation to daily life    Person(s) Educated  Patient    Methods  Explanation;Demonstration;Handout    Comprehension  Verbalized understanding;Returned demonstration       OT Short Term Goals - 06/05/17 2136      OT SHORT TERM GOAL #1   Title  Patient will be educated on strategies to improve psychosocial skills needed to participate fully in all daily, work, and leisure activities.    Time  3    Period  Weeks    Status  On-going      OT SHORT TERM GOAL #2   Title  Patient will be educated on a HEP and independent with implementation of HEP.    Time  3    Period  Weeks    Status  On-going      OT SHORT TERM GOAL #3   Title  Patient will independently apply psychosocial skills and coping mechanisms to her daily activities in order to function independently.     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 PCOS (polycystic ovarian syndrome) 05/24/2015  . Anxiety state 09/18/2006  . ADHD 06/12/2006   Dalphine Handing, MSOT, OTR/L  [employer] 06/17/2017, 1:44 PM  [facility] PARTIAL HOSPITALIZATION PROGRAM [address] [place], [place], [address] Phone: [phone]   Fax:  [phone]  Name: Sherry Mercado MRN: [id number] Date of Birth: 1990-03-26

## 2017-06-18 ENCOUNTER — Other Ambulatory Visit (HOSPITAL_COMMUNITY): Payer: Self-pay

## 2017-06-18 ENCOUNTER — Other Ambulatory Visit (HOSPITAL_COMMUNITY): Payer: BC Managed Care – PPO | Attending: Psychiatry | Admitting: Licensed Clinical Social Worker

## 2017-06-18 ENCOUNTER — Other Ambulatory Visit (HOSPITAL_COMMUNITY): Payer: BC Managed Care – PPO

## 2017-06-18 ENCOUNTER — Encounter (HOSPITAL_COMMUNITY): Payer: Self-pay

## 2017-06-18 VITALS — BP 114/68 | HR 93 | Ht 66.0 in | Wt 220.0 lb

## 2017-06-18 DIAGNOSIS — F411 Generalized anxiety disorder: Secondary | ICD-10-CM

## 2017-06-18 DIAGNOSIS — R4589 Other symptoms and signs involving emotional state: Secondary | ICD-10-CM | POA: Insufficient documentation

## 2017-06-18 DIAGNOSIS — R411 Anterograde amnesia: Secondary | ICD-10-CM | POA: Insufficient documentation

## 2017-06-18 DIAGNOSIS — F332 Major depressive disorder, recurrent severe without psychotic features: Secondary | ICD-10-CM

## 2017-06-18 DIAGNOSIS — F0789 Other personality and behavioral disorders due to known physiological condition: Secondary | ICD-10-CM | POA: Diagnosis not present

## 2017-06-18 MED ORDER — HYDROXYZINE HCL 10 MG PO TABS: ORAL_TABLET | ORAL | 0 refills | Status: DC

## 2017-06-18 NOTE — Progress Notes (Signed)
Patient presents with appropriate affect, depressed mood but admitted this has improved and feels "my medications are working".  Patient denied any suicidal or homicidal ideations, no auditory or visual hallucinations and no plans or intent to harm self or others.  Patient reported she currently has strep throat and has been on Amoxicillin 500 mg, twice a day since 06/16/17 and is starting to feel better.  Patient reported cough has improved and denies any problems with sleep or appetite.  Patient reported plans to complete [facility] Friday 06/20/17 and plans to follow up with individual therapy with [name] later and Dr. Lolly Mustache both at this practice. Patient rated her currently level of depression a 2-3, anxiety a 4 and hopelessness a 3 on a scale of 0-10 with 0 being none and 10 the worst she could manage.  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  Patient reported being happy with current medication regimen and agreed to inform this nurse or PHP staff if any problems with medications or worsening of symptoms prior to ending PHP and will also contact our office back if any changes after that to follow up with Dr. Lolly Mustache.  Patient stable at this time and agreed if not a lot better with strep throat diagnosis after 2-3 more days to call her PCP's office back to follow up.

## 2017-06-18 NOTE — Psych (Signed)
[facility] BH PHP THERAPIST PROGRESS NOTE  Sherry Mercado [id number]  Session Time: 9:00 - 10:30  Participation Level: Active  Behavioral Response: CasualAlertAnxious  Type of Therapy: Group Therapy , Psychotherapy  Treatment Goals addressed: Coping  Interventions: CBT, DBT, Strength-based, Supportive and Reframing  Summary: Clinician led check-in regarding current stressors and situation, and review of patient completed daily inventory. Clinician utilized active listening and empathetic response and validated patient emotions. Clinician facilitated processing group on pertinent issues.   Therapist Response: Sherry Mercado is a 27 y.o. female who presents with anxiety symptoms. Pt arrived within time allowed and reports that she is still feeling sick. Pt rates her mood at a 4 on a scale of 1-10 with 10 being great. Pt reports that she did not do anything this weekend except rest.  Pt reports she is still working on standing up for herself. Pt engaged in discussion.        Session Time: 10:30 -11:15    Participation Level: Active   Behavioral Response: CasualAlertDepressed   Type of Therapy: Group Therapy, Psychotherapy; Psychoeducation   Treatment Goals addressed: Coping   Interventions: CBT, Solution focused, Supportive, Reframing   Summary: Clinician introduced topic of communication. Cln educated group on 4 communication styles: assertive, aggressive, passive, and passive-aggressive. Group gave examples of the 4 styles and identified which is their default style.   Therapist Response: Patient engaged activity and discussion. Pt reports default communication style of passive. Pt reports understanding of the communication styles and how they set tone for interpersonal communication.       Session Time: 11:15 - 12:15  Participation Level: Active   Behavioral Response: CasualAlertDepressed   Type of Therapy: Group Therapy, OT   Treatment Goals addressed: Coping    Interventions: Psychosocial skills training, Supportive,    Summary:  Occupational Therapy group   Therapist Response: Patient engaged in group. See OT note.         Session Time: 12:15 - 1:00  Participation Level: Active  Behavioral Response: CasualAlertDepressed  Type of Therapy: Group Therapy, Activity Therapy  Treatment Goals addressed: Coping  Interventions: Psychologist, occupational, Supportive  Summary:  Reflection Group: Patients encouraged to practice skills and interpersonal techniques or work on mindfulness and relaxation techniques. The importance of self-care and making skills part of a routine to increase usage were stressed   Therapist Response: Patient engaged and participated appropriately.       Session Time: 12:45- 2:00  Participation Level: Active  Behavioral Response: CasualAlertAnxious and Depressed  Type of Therapy: Group Therapy, Psychoeducation; Psychotherapy  Treatment Goals addressed: Coping  Interventions: CBT; Solution focused; Supportive; Reframing  Summary: 12:45 - 1:50: Cln continued topic of communication. Clinician discussed assertive traits and how to alter statements into more assertive communications. Group provided examples from their own lives.  1:50 -2:00 Clinician led check-out. Clinician assessed for immediate needs, medication compliance and efficacy, and safety concerns   Therapist Response: Patient engaged in group. Pt was able to give examples of times in which she was not assertive and alter those situations to be more assertive.  At check-out, patient rates her mood at a 5 on a scale of 1-10 with 10 being great. Patient reports she is going to the doctor to get medication for her cold and resting.  Patient demonstrates some progress as evidenced by reporting a time she was assertive over the weekend. Patient denies SI/HI/self-harm at the end of group.     Suicidal/Homicidal: Nowithout intent/plan   Plan: Pt will  continue in PHP while working to decrease anxiety symptoms and increase ability to self manage those symptoms.   Diagnosis: GAD (generalized anxiety disorder) [F41.1]    1. GAD (generalized anxiety disorder)   2. Severe episode of recurrent major depressive disorder, without psychotic features (HCC)       Sherry Guiles, LCSW 06/18/2017

## 2017-06-19 ENCOUNTER — Encounter (HOSPITAL_COMMUNITY): Payer: Self-pay | Admitting: Occupational Therapy

## 2017-06-19 ENCOUNTER — Other Ambulatory Visit (HOSPITAL_COMMUNITY): Payer: BC Managed Care – PPO | Admitting: Licensed Clinical Social Worker

## 2017-06-19 ENCOUNTER — Other Ambulatory Visit (HOSPITAL_COMMUNITY): Payer: BC Managed Care – PPO | Admitting: Occupational Therapy

## 2017-06-19 DIAGNOSIS — F0789 Other personality and behavioral disorders due to known physiological condition: Secondary | ICD-10-CM

## 2017-06-19 DIAGNOSIS — F411 Generalized anxiety disorder: Secondary | ICD-10-CM

## 2017-06-19 DIAGNOSIS — F332 Major depressive disorder, recurrent severe without psychotic features: Secondary | ICD-10-CM

## 2017-06-19 DIAGNOSIS — R4589 Other symptoms and signs involving emotional state: Secondary | ICD-10-CM

## 2017-06-19 DIAGNOSIS — R411 Anterograde amnesia: Secondary | ICD-10-CM | POA: Diagnosis not present

## 2017-06-19 NOTE — Psych (Signed)
[facility] BH PHP THERAPIST PROGRESS NOTE  Sherry Mercado [id number]  Session Time: 9:00 - 10:30  Participation Level: Active  Behavioral Response: CasualAlertAnxious  Type of Therapy: Group Therapy , Psychotherapy  Treatment Goals addressed: Coping  Interventions: CBT, DBT, Strength-based, Supportive and Reframing  Summary: Clinician led check-in regarding current stressors and situation, and review of patient completed daily inventory. Clinician utilized active listening and empathetic response and validated patient emotions. Clinician facilitated processing group on pertinent issues.   Therapist Response: Sherry Mercado is a 27 y.o. female who presents with anxiety symptoms. Pt arrived within time allowed and reports that she is still feeling sick. Pt rates her mood at a 4 on a scale of 1-10 with 10 being great. Pt reports the doctor told her she has strep and she is feeling guilty about having been around people before knowing. Pt states heightened anxiety and not knowing why.  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 Pt engaged in discussion.        Session Time: 10:30 -11:15    Participation Level: Active   Behavioral Response: CasualAlertDepressed   Type of Therapy: Group Therapy, Psychotherapy; Psychoeducation   Treatment Goals addressed: Coping   Interventions: CBT, Solution focused, Supportive, Reframing   Summary: Cln introduced topic of communication. Cln discussed 3 components of communication: nonverbals, words, and listening. Group discussed nonverbals: what they are and how they affect communication.    Therapist Response: Patient engaged in group. Pt was able to give examples of nonverbal and demonstrated awareness of how her nonverbals are taken. Pt reports needing most improvement with spacing.        Session Time: 11:15 - 12:15  Participation Level: Active   Behavioral Response: CasualAlertDepressed   Type of Therapy: Group  Therapy, OT   Treatment Goals addressed: Coping   Interventions: Psychosocial skills training, Supportive,    Summary:  Occupational Therapy group   Therapist Response: Patient engaged in group. See OT note.         Session Time: 12:15 - 1:00  Participation Level: Active  Behavioral Response: CasualAlertDepressed  Type of Therapy: Group Therapy, Activity Therapy  Treatment Goals addressed: Coping  Interventions: Psychologist, occupational, Supportive  Summary:  Reflection Group: Patients encouraged to practice skills and interpersonal techniques or work on mindfulness and relaxation techniques. The importance of self-care and making skills part of a routine to increase usage were stressed   Therapist Response: Patient engaged and participated appropriately.       Session Time: 12:45- 2:00  Participation Level: Active  Behavioral Response: CasualAlertAnxious and Depressed  Type of Therapy: Group Therapy, Psychoeducation; Psychotherapy  Treatment Goals addressed: Coping  Interventions: CBT; Solution focused; Supportive; Reframing  Summary: 12:45 - 1:50: Cln continued topic of communication. Cln introduced "I" Statements and how to formulate them as well as common pitfalls and how to avoid them. 1:50 -2:00 Clinician led check-out. Clinician assessed for immediate needs, medication compliance and efficacy, and safety concerns   Therapist Response: Patient engaged in group. Pt reports understanding of "I" Statements and successfully formulated her own in practice.   At check-out, patient rates her mood at a 5 on a scale of 1-10 with 10 being great. Patient reports plans of resting and recooperating.  Patient demonstrates neutral progress as evidenced by reporting use of skills however also struggling to follow through with daily goals set. Patient denies SI/HI/self-harm at the end of group.     Suicidal/Homicidal: Nowithout intent/plan   Plan: Pt will continue  in  PHP while working to decrease anxiety symptoms and increase ability to self manage those symptoms.   Diagnosis: GAD (generalized anxiety disorder) [F41.1]    1. GAD (generalized anxiety disorder)   2. Severe episode of recurrent major depressive disorder, without psychotic features (HCC)       [name], LCSW 06/19/2017

## 2017-06-19 NOTE — Psych (Signed)
   [facility] BH PHP THERAPIST PROGRESS NOTE  Sherry Mercado [id number]  Session Time: 9:00 - 10:45  Participation Level: Active  Behavioral Response: CasualAlertAnxious  Type of Therapy: Group Therapy  Treatment Goals addressed: Coping  Interventions: CBT, DBT, Strength-based, Supportive and Reframing  Summary: Clinician led check-in regarding current stressors and situation, and review of patient completed daily inventory. Clinician utilized active listening and empathetic response and validated patient emotions. Clinician facilitated processing group on pertinent issues.   Therapist Response: [name] is a 27 y.o. female who presents with anxiety symptoms. Patient arrived within time allowed and reports she is feeling "a little optimistic." Pt rates her mood at a 6 on a scale of 1-10 with 10 being great. Pt reports that she had a "fine" afternoon and feels her medication is helping her throat. Pt shares a moment of positive communication with her boyfriend. Pt reports she is still working on emotional regulation. Pt engaged in discussion.       Session Time: 10:45 -12:15  Participation Level: Active  Behavioral Response: CasualAlertAnxious  Type of Therapy: Group Therapy, psychotherapy  Treatment Goals addressed: Coping  Interventions: Strengths based, reframing, Supportive,   Summary:  Spiritual Care group  Therapist Response: Patient engaged in group. See chaplain note.        Session Time: 12:15 - 1:00  Participation Level: Active  Behavioral Response: CasualAlertAnxious  Type of Therapy: Group Therapy, Activity Therapy  Treatment Goals addressed: Coping  Interventions: Psychologist, occupational, Supportive  Summary:  Reflection Group: Patients encouraged to practice skills and interpersonal techniques or work on mindfulness and relaxation techniques. The importance of self-care and making skills part of a routine to increase usage were stressed    Therapist Response: Patient engaged and participated appropriately.       Session Time: 1:00 - 2:00   Participation Level: Active   Behavioral Response: CasualAlertDepressed   Type of Therapy: Group Therapy, Psychotherapy; Psychoeducation   Treatment Goals addressed: Coping   Interventions: CBT, Solution focused, Supportive, Reframing   Summary: 1:00 - 1:50 Cln continued topic of communication. Cln led review of what has been discussed. Cln discussed handout "The characteristics of bad communication" and group shared how they have engaged with those characteristics and how to avoid them in the future. 1:50 -2:00 Clinician led check-out. Clinician assessed for immediate needs, medication compliance and efficacy, and safety concerns   Therapist Response: Patient engaged in group. Pt recognized characteristics she has used particularly stonewalling and hopelessness.  At check-out, patient rates her mood at a 6 on a scale of 1-10 with 10 being great. Patient reports that she has no plans this afternoon. Patient demonstrates some progress as evidenced by relating ways she is applying skills learned in group at home. Patient denies SI/HI/self-harm thoughts at the end of group     Suicidal/Homicidal: Nowithout intent/plan   Plan: Pt will continue in PHP while working to decrease anxiety symptoms and increase ability to self manage those symptoms.   Diagnosis: GAD (generalized anxiety disorder) [F41.1]    1. GAD (generalized anxiety disorder)   2. Severe episode of recurrent major depressive disorder, without psychotic features (HCC)       [name], LCSW, LCAS 06/19/2017

## 2017-06-19 NOTE — Therapy (Signed)
[facility] PARTIAL HOSPITALIZATION PROGRAM [address] [place], [place], [address] Phone: [phone]   Fax:  [phone]  Occupational Therapy Treatment  Patient Details  Name: Virdia Ziesmer MRN: [id number] Date of Birth: 12-18-1990 Referring Provider: Hillery Jacks, NP   Encounter Date: 06/19/2017  OT End of Session - 06/19/17 1403    Visit Number  6    Number of Visits  6    Date for OT Re-Evaluation  06/27/17    Authorization Type  [employer]    Activity Tolerance  Patient tolerated treatment well    Behavior During Therapy  [facility] for tasks assessed/performed       Past Medical History:  Diagnosis Date  . Allergy    Zyrtec PRN  . Anxiety   . Depression   . PCOS (polycystic ovarian syndrome) 2016    Past Surgical History:  Procedure Laterality Date  . EYE SURGERY     tear duct procedure  . WISDOM TOOTH EXTRACTION Bilateral 05/2017    There were no vitals filed for this visit.  Subjective Assessment - 06/19/17 1402    Currently in Pain?  No/denies       S:"I find it difficult to find 26 positive things about myself"  O: Education given on definition and importance of positive self-esteem in daily life and relationships with focus on using positive self-talk. Further education given on the relationship between self-esteem and mental illness and both high and low self-esteem factors. Worksheet given for pt to identify a positive trait about themselves with each letter of the alphabet to use as reference for future times of need and to gain insight on numerous positive qualities. Expressive activity then given for pt to design an advertisement as to why someone should be their friend to share and reflect with the group. Encouragement given to display both worksheet and expressive activity in common viewed area (bathroom, bedroom) to help foster self-esteem in daily life.  A: Pt was actively engaged and participatory throughout entirety of group  this date. Pt completed self-esteem alphabet activity with minimal verbal cues. Pt also provided support to other group members to help brainstorm ideas. Pt also completed expressive advertisement activity with self-direction and no verbal cues. Pt showed an improved affect when reflecting on positive qualities during treatment activities this date.  P: Pt provided with self-esteem boosting skills to implement into a variety of daily activities/routines. OT will continue to follow up with communication skills for successful implementation into daily life.                     OT Education - 06/19/17 1402    Education provided  Yes    Education Details  education given on self esteem improvement and maintenance    Person(s) Educated  Patient    Methods  Explanation;Handout    Comprehension  Verbalized understanding       OT Short Term Goals - 06/05/17 2136      OT SHORT TERM GOAL #1   Title  Patient will be educated on strategies to improve psychosocial skills needed to participate fully in all daily, work, and leisure activities.    Time  3    Period  Weeks    Status  On-going      OT SHORT TERM GOAL #2   Title  Patient will be educated on a HEP and independent with implementation of HEP.    Time  3    Period  Weeks    Status  On-going      OT SHORT TERM GOAL #3   Title  Patient will independently apply psychosocial skills and coping mechanisms to her daily activities in order to function independently.    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 PCOS (polycystic ovarian syndrome) 05/24/2015  . Anxiety state 09/18/2006  . ADHD 06/12/2006   Dalphine Handing, MSOT, OTR/L  [place] 06/19/2017, 2:06 PM  [facility] PARTIAL HOSPITALIZATION PROGRAM [address] [place], [place], [address] Phone: [phone]   Fax:  [phone]  Name: Ramata Strothman MRN: [id number] Date of Birth: [birth year]

## 2017-06-20 ENCOUNTER — Encounter (HOSPITAL_COMMUNITY): Payer: Self-pay | Admitting: Occupational Therapy

## 2017-06-20 ENCOUNTER — Other Ambulatory Visit (HOSPITAL_COMMUNITY): Payer: BC Managed Care – PPO | Admitting: Occupational Therapy

## 2017-06-20 ENCOUNTER — Other Ambulatory Visit (HOSPITAL_COMMUNITY): Payer: BC Managed Care – PPO | Admitting: Licensed Clinical Social Worker

## 2017-06-20 ENCOUNTER — Encounter (HOSPITAL_COMMUNITY): Payer: Self-pay | Admitting: Family

## 2017-06-20 DIAGNOSIS — F411 Generalized anxiety disorder: Secondary | ICD-10-CM

## 2017-06-20 DIAGNOSIS — R4589 Other symptoms and signs involving emotional state: Secondary | ICD-10-CM

## 2017-06-20 DIAGNOSIS — F332 Major depressive disorder, recurrent severe without psychotic features: Secondary | ICD-10-CM

## 2017-06-20 DIAGNOSIS — R411 Anterograde amnesia: Secondary | ICD-10-CM | POA: Diagnosis not present

## 2017-06-20 DIAGNOSIS — F0789 Other personality and behavioral disorders due to known physiological condition: Secondary | ICD-10-CM

## 2017-06-20 NOTE — Progress Notes (Signed)
  [facility] Partial Hospitalization Program Psych Discharge Summary  Sherry Mercado [id number]  Admission date: 06/03/2017 Discharge date: 06/20/2017  Reason for admission: Worsening Anxiety and Depression   Per assessment note: Sherry Mercado 27 y.o Caucasian female presents after referral from her Psychiatrist.  Patient reports increased anxiety and depressive symptoms for the past 4 months that has been increasingly worse.  Reports feeling codependent on her boyfriend and father whom she resides with.  Reports she recently had to take a semester off from her masters program due to her anxiety.  Patient reports she is still socially awkward and not engaged with others.  Reports she was recently started on Effexor for depression and anxiety and states she feels like it is helping.  Currently she denies suicidal or homicidal ideation.  Rates her depression as 8 out of 10 anxiety 8 out of 10.  Patient is prescribed Effexor 75 mg reports she is taken and tolerating medications well.  Hydroxyzine is prescribed  however states she has not taken medication as of yet. Sherry Mercado denied any previous inpatient admissions.  Reported family history of bipolar depression reports younger brother has psychosis.  Sherry Mercado denies illicit drug use however reports using marijuana occasionally. States she is a social drinker i.e. 2 glasses a week.  Denies insomnia or decreased appetite.  Support and encouragement and reassurance was provided.    Progress in Program Toward Treatment Goals: Promiss attended and participated with group session.  Sherry Mercado was evaluated at discharge presents with a brighter affect than on admission.  States  group session  was helpful and provided the " foundation and framework" to help improved her communication skills. Sherry Mercado continues  to report feeling codependent with her father and her significant other. Denies suicidal or homicidal ideation at discharge. Rates her depression 3/10. Support,  encouragement and reassurance was provided.    Progress (rationale): Ongoing  Discharge Plan: Appointment schedule with Psychiatrist Afreen and MD McKenzie.  Follow-up with therapist 06/24/2017 with [facility] outpatient behavioral [facility] Sleep study referral was initiated- see chart      Take all medications as prescribed. Keep all follow-up appointments as scheduled.  Do not consume alcohol or use illegal drugs while on prescription medications. Report any adverse effects from your medications to your primary care provider promptly.  In the event of recurrent symptoms or worsening symptoms, call 911, a crisis hotline, or go to the nearest emergency department for evaluation.    Sherry Rack, NP 06/20/2017

## 2017-06-20 NOTE — Therapy (Signed)
[facility] PARTIAL HOSPITALIZATION PROGRAM [address] [place], [place], [address] Phone: [phone]   Fax:  [phone]  Occupational Therapy Treatment  Patient Details  Name: Adrianne Shackleton MRN: [id number] Date of Birth: 1990/11/21 Referring Provider: Hillery Jacks, NP   Encounter Date: 06/20/2017  OT End of Session - 06/20/17 1416    Visit Number  7    Number of Visits  7    Date for OT Re-Evaluation  06/27/17    Authorization Type  BC[employer]    OT Start Time  1030    OT Stop Time  1130    OT Time Calculation (min)  60 min    Activity Tolerance  Patient tolerated treatment well    Behavior During Therapy  [facility] for tasks assessed/performed       Past Medical History:  Diagnosis Date  . Allergy    Zyrtec PRN  . Anxiety   . Depression   . PCOS (polycystic ovarian syndrome) 2016    Past Surgical History:  Procedure Laterality Date  . EYE SURGERY     tear duct procedure  . WISDOM TOOTH EXTRACTION Bilateral 05/2017    There were no vitals filed for this visit.  Subjective Assessment - 06/20/17 1415    Currently in Pain?  No/denies        S: "I like to listen to podcasts when I go for walks, it makes me motivated" O: Education given on the importance of exercise in daily routines as healthy coping strategies. Contraindications and health exercise patterns discussed for safety. Pt provided with hand outs of chair exercise and yoga, with return demonstration during group session this date. Exercise log given to track workouts for added structure in daily routine. Pt to identify short term/achievable exercise goal to implement into daily routines within the next week.  A: Pt was actively engaged and participatory throughout the entirety of group this date. Pt completed return demonstration of chair exercise and yoga with group. Pt engaged with facilitator for tips and questions of proper form during exercise. Pt identified goal of walking 30-40  minutes 3-4 times per week after dinner with her boyfriend. Pt desires to use hand outs when at home to continue implementation of exercise into daily routine. P: Pt provided with self-esteem boosting skills to implement into a variety of daily activities/routines. OT will continue to follow up with communication skills for successful implementation into daily life.                      OT Education - 06/20/17 1415    Education provided  Yes    Education Details  education given on importance and demonstration of exercise and yoga in daily routines    Person(s) Educated  Patient    Methods  Explanation;Demonstration;Handout    Comprehension  Verbalized understanding;Returned demonstration       OT Short Term Goals - 06/05/17 2136      OT SHORT TERM GOAL #1   Title  Patient will be educated on strategies to improve psychosocial skills needed to participate fully in all daily, work, and leisure activities.    Time  3    Period  Weeks    Status  On-going      OT SHORT TERM GOAL #2   Title  Patient will be educated on a HEP and independent with implementation of HEP.    Time  3    Period  Weeks  Status  On-going      OT SHORT TERM GOAL #3   Title  Patient will independently apply psychosocial skills and coping mechanisms to her daily activities in order to function independently.    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 PCOS (polycystic ovarian syndrome) 05/24/2015  . Anxiety state 09/18/2006  . ADHD 06/12/2006    Dalphine Handing 06/20/2017, 2:19 PM  [facility] HOSPITALIZATION PROGRAM [address] [place], [place], [address] Phone: [phone]   Fax:  [phone]  Name: Corvette Orser MRN: [id number] Date of Birth: Jun 30, 1990

## 2017-06-23 ENCOUNTER — Other Ambulatory Visit (HOSPITAL_COMMUNITY): Payer: Self-pay

## 2017-06-23 NOTE — Psych (Signed)
[facility] BH PHP THERAPIST PROGRESS NOTE  Sherry Mercado [id number]  Session Time: 9:00 - 10:30  Participation Level: Active  Behavioral Response: CasualAlertAnxious  Type of Therapy: Group Therapy , Psychotherapy  Treatment Goals addressed: Coping  Interventions: CBT, DBT, Strength-based, Supportive and Reframing  Summary: Clinician led check-in regarding current stressors and situation, and review of patient completed daily inventory. Clinician utilized active listening and empathetic response and validated patient emotions. Clinician facilitated processing group on pertinent issues.   Therapist Response: Sherry Mercado is a 27 y.o. female who presents with anxiety symptoms. Pt arrived within time allowed and reports that she is still feeling "neutral." Pt rates her mood at a 5 on a scale of 1-10 with 10 being great. Pt reports she is feeling tired because she did not go to sleep until late.  Pt reports she is still working on healthy boundaries with her boyfriend. Pt engaged in discussion.        Session Time: 10:30 - 11:15  Participation Level: Active  Behavioral Response: CasualAlertDepressed  Type of Therapy: Group Therapy, Psychotherapy  Treatment Goals addressed: Coping  Interventions: CBT, Solution focused, Supportive, Reframing  Summary:  Cln introduced distress tolerance skills, reviewing their purpose and how to practice them. Cln introduced STOP and TIP skills. Pt's discussed how they could utilize these skills.      Therapist Response: Patient engaged in discussion. Pt reports understanding of skills discussed and shares most likely to use paced breathing.        Session Time: 11:15 -12:15   Participation Level: Active   Behavioral Response: CasualAlertDepressed   Type of Therapy: Group Therapy, OT   Treatment Goals addressed: Coping   Interventions: Psychosocial skills training, Supportive,    Summary:  Occupational Therapy group   Therapist  Response: Patient engaged in group. See OT note.        Session Time: 12:15 - 1:00  Participation Level: Active  Behavioral Response: CasualAlertDepressed  Type of Therapy: Group Therapy, Activity Therapy  Treatment Goals addressed: Coping  Interventions: Psychologist, occupational, Supportive  Summary:  Reflection Group: Patients encouraged to practice skills and interpersonal techniques or work on mindfulness and relaxation techniques. The importance of self-care and making skills part of a routine to increase usage were stressed   Therapist Response: Patient engaged and participated appropriately.       Session Time: 1:00- 2:00  Participation Level: Active  Behavioral Response: CasualAlertAnxious and Depressed  Type of Therapy: Group Therapy, Psychoeducation; Psychotherapy  Treatment Goals addressed: Coping  Interventions: CBT; Solution focused; Supportive; Reframing  Summary: 12:45 - 1:50: Clinician continued topic of distress tolerance skills and introduced ACCEPTS skill. Group did "Top 5 List" activity to plan ahead for "E," different emotion.  1:50 -2:00 Clinician led check-out. Clinician assessed for immediate needs, medication compliance and efficacy, and safety concerns   Therapist Response: Patient engaged activity and discussion. Pt reports playing with her pets as a way to utilize "A," activities, and completed 3 top 5 lists.  At check-out, patient rates her mood at a 6 on a scale of 1-10 with 10 being great. Patient reports plans of going on a walk this afternoon.  Patient demonstrates some progress as evidenced by sharing communication successes with her dad. Patient denies SI/HI/self-harm at the end of group.      Suicidal/Homicidal: Nowithout intent/plan  Plan: Pt will  continue in PHP while working to decrease anxiety symptoms and increase ability to self manage those symptoms.   Diagnosis: GAD (generalized anxiety disorder) [F41.1]    1. GAD (generalized anxiety disorder)   2. Severe episode of recurrent major depressive disorder, without psychotic features (HCC)       [name], LCSW 06/23/2017

## 2017-06-24 ENCOUNTER — Ambulatory Visit (INDEPENDENT_AMBULATORY_CARE_PROVIDER_SITE_OTHER): Payer: BC Managed Care – PPO | Admitting: Licensed Clinical Social Worker

## 2017-06-24 DIAGNOSIS — F332 Major depressive disorder, recurrent severe without psychotic features: Secondary | ICD-10-CM

## 2017-06-24 DIAGNOSIS — F411 Generalized anxiety disorder: Secondary | ICD-10-CM

## 2017-06-24 NOTE — Therapy (Signed)
[facility] [place], [place], [address] Phone: [phone]   Fax:  [phone]   Patient Details  Name: Sherry Mercado MRN: [id number] Date of Birth: April 27, 1990 Referring Provider: Ricky Ala, NP   OCCUPATIONAL THERAPY DISCHARGE SUMMARY  Visits from Start of Care: 6  Current functional level related to goals / functional outcomes: Improved ability to integrate into the community and ability to apply psychosocial coping strategies into daily life to manage diagnosis.   Remaining deficits: Pt still continuing to work on accountability in daily situations.   Education / Equipment: Education given on a variety of coping and psychosocial tools to manage diagnosis in daily environment. Plan: Patient agrees to discharge.  Patient goals were met. Patient is being discharged due to meeting the stated rehab goals.  ?????           Zenovia Jarred, MSOT, OTR/L

## 2017-06-25 ENCOUNTER — Encounter (HOSPITAL_COMMUNITY): Payer: Self-pay | Admitting: Licensed Clinical Social Worker

## 2017-06-25 ENCOUNTER — Other Ambulatory Visit (HOSPITAL_COMMUNITY): Payer: Self-pay

## 2017-06-25 NOTE — Psych (Signed)
[facility] BH PHP THERAPIST PROGRESS NOTE  Sherry Mercado [id number]  Session Time: 9:00 - 10:30  Participation Level: Active  Behavioral Response: CasualAlertAnxious  Type of Therapy: Group Therapy , Psychotherapy  Treatment Goals addressed: Coping  Interventions: CBT, DBT, Strength-based, Supportive and Reframing  Summary: Clinician led check-in regarding current stressors and situation, and review of patient completed daily inventory. Clinician utilized active listening and empathetic response and validated patient emotions. Clinician facilitated processing group on pertinent issues.   Therapist Response: Sherry Mercado is a 27 y.o. female who presents with anxiety symptoms. Pt arrived within time allowed and reports that she is still feeling "proud." Pt rates her mood at a 6 on a scale of 1-10 with 10 being great. Pt shares she is feeling good about progress made and that she is completing this program. Pt reports yesterday she listened to podcasts and walked with her dog. 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 Pt engaged in discussion.        Session Time: 10:30 -11:30   Participation Level: Active   Behavioral Response: CasualAlertDepressed   Type of Therapy: Group Therapy, OT   Treatment Goals addressed: Coping   Interventions: Psychosocial skills training, Supportive,    Summary:  Occupational Therapy group   Therapist Response: Patient engaged in group. See OT note.           Session Time: 11:30 - 12:00   Participation Level: Active   Behavioral Response: CasualAlertDepressed   Type of Therapy: Group Therapy, Psychotherapy   Treatment Goals addressed: Coping   Interventions: CBT, Solution focused, Supportive, Reframing   Summary:  Group continued topic of distress tolerance. Cln reviewed skills discussed yesterday and group members discussed ways they put them into practice last night. Cln finished ACCEPTS skill with group.     Therapist Response: Patient engaged in group. Pt states she attempted to use "A" activities and listened to podcasts to distract herself.         Session Time: 12:00- 1:00  Participation Level: Active  Behavioral Response: CasualAlertAnxious and Depressed  Type of Therapy: Group Therapy, Psychoeducation; Psychotherapy  Treatment Goals addressed: Coping  Interventions: CBT; Solution focused; Supportive; Reframing  Summary: 12:00 - 12:50: Clinician continued topic of distress tolerance skills and introduced Self Soothe skill. Group members discussed ways they would utilize self soothe in their everyday life.   12:50 -1:00 Clinician led check-out. Clinician assessed for immediate needs, medication compliance and efficacy, and safety concerns   Therapist Response: Patient engaged activity and discussion. Pt reports smelling candles and drinking tea as ways she can practice self soothe.  At check-out, patient rates her mood at a 6 on a scale of 1-10 with 10 being great. Patient reports plans of going to the movies this weekend. Patient demonstrates some progress as evidenced by increased communication and awareness regarding her struggles. Patient denies SI/HI/self-harm at the end of group.    Suicidal/Homicidal: Nowithout intent/plan  Plan: Pt will discharge from PHP due to meeting treatment goals of decreased anxiety and depression and increased ability to self manage symptoms. Pt's progress is noted by observation, self report, and scales. Pt and provider are aligned with discharge. Pt has declined IOP and will step down to individual therapy and psychiatry. Pt has requested to return to previous providers. Pt has follow up appointments with therapist, [name] 06/24/17 and [name] for psychiatry on  07/17/17.    Diagnosis: GAD (generalized anxiety disorder) [F41.1]    1. GAD (generalized anxiety disorder)   2. Severe episode of recurrent major depressive disorder, without psychotic features (HCC)       Sherry Guiles, LCSW 06/25/2017

## 2017-06-25 NOTE — Progress Notes (Signed)
   THERAPIST PROGRESS NOTE  Session Time: 1:10-2pm  Participation Level: Active  Behavioral Response: CasualAlertAnxious  Type of Therapy: Individual Therapy  Treatment Goals addressed: Coping  Interventions: Motivational Interviewing and Supportive  Summary: Sherry Mercado is a 27 y.o. female who presents for her initial individual counseling session since completion of PHP. Pt presented scattered and anxious. She reports she has been having negative thoughts continuously. She has an appt with Dr. Lolly Mustache, psychiatrist,  in 3 weeks. Taught pt how to challenge negative thoughts. Gave pt worksheet to complete and reviewed and discussed. Asked pt about her coping skills learned in PHP. She said she learned some but isn't really using them. Pt reports she is struggling some with effective communication styles. She wants to become more assertive and less passive. Role played with pt assertive communication styles. Gave her worksheet on effective communication styles, reviewed it and discussed it. 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 Discussed her feelings/challenges about leaving for the summer. Reviewed STOP method for pt to use when she has spiraling thoughts.      idal/Homicidal: Nowithout intent/plan  Therapist Response: Evaluated pt's current functioning and reviewed progress. Assisted pt building a trusting therapeutic relationship. Assisted pt learning how to challenge negative thoughts, effective communication styles, effective STOP method to use for spiraling thoughts.   Plan: Return after summer job.  Diagnosis: Axis I:  GAD    Maylyn Narvaiz S, LCAS  06/24/17

## 2017-06-26 ENCOUNTER — Other Ambulatory Visit (HOSPITAL_COMMUNITY): Payer: Self-pay

## 2017-06-27 ENCOUNTER — Other Ambulatory Visit (HOSPITAL_COMMUNITY): Payer: Self-pay

## 2017-07-04 ENCOUNTER — Encounter: Payer: Self-pay | Admitting: Family Medicine

## 2017-07-09 ENCOUNTER — Encounter: Payer: Self-pay | Admitting: Family Medicine

## 2017-07-16 ENCOUNTER — Other Ambulatory Visit (HOSPITAL_COMMUNITY): Payer: Self-pay | Admitting: Psychiatry

## 2017-07-16 DIAGNOSIS — F411 Generalized anxiety disorder: Secondary | ICD-10-CM

## 2017-07-17 ENCOUNTER — Encounter (HOSPITAL_COMMUNITY): Payer: Self-pay | Admitting: Psychiatry

## 2017-07-17 ENCOUNTER — Ambulatory Visit (HOSPITAL_COMMUNITY): Payer: BC Managed Care – PPO | Admitting: Psychiatry

## 2017-07-17 DIAGNOSIS — F411 Generalized anxiety disorder: Secondary | ICD-10-CM

## 2017-07-17 MED ORDER — VENLAFAXINE HCL ER 150 MG PO CP24: 150.0000 mg | ORAL_CAPSULE | Freq: Every day | ORAL | 0 refills | Status: DC

## 2017-07-17 MED ORDER — HYDROXYZINE HCL 10 MG PO TABS: ORAL_TABLET | ORAL | 0 refills | Status: AC

## 2017-07-17 NOTE — Progress Notes (Signed)
BH MD/PA/NP OP Progress Note  07/17/2017 2:02 PM Sherry Mercado  MRN:  [id number]  Chief Complaint: I am feeling anxious for past few weeks.  I do not think my medicine is a strong enough.  HPI:  Sherry Mercado came for her follow-up appointment.  She is taking Effexor 75 mg daily.  She was feeling fine until 2 weeks ago she started to have anxiety coming back.  She feels nervous, anxious and having panic attacks.  She is excited about leaving next Tuesday for 77-month.  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  She admitted there are times that she does not sleep very well.  However she denies any suicidal thoughts or homicidal thought.  Her energy level is fair.  She is seeing Lavada Mesi but she need to take some time off from therapy since she is busy in her summer job.  Patient cut down her drinking from the past.  She is not smoking marijuana since the last visit.  She still feels very nervous and anxious and does not leave the house unless her father or boyfriend is with her.  Patient denies any agitation, mania, psychosis or any hallucination.  Recently she had tooth extraction and she was given pain medication but she is no longer taking it.  Patient has no tremors, shakes or any EPS.  Visit Diagnosis:    ICD-10-CM   1. GAD (generalized anxiety disorder) F41.1 hydrOXYzine (ATARAX/VISTARIL) 10 MG tablet    venlafaxine XR (EFFEXOR-XR) 150 MG 24 hr capsule    Past Psychiatric History: Reviewed Patient diagnosed with ADHD in her school age.  She tried stimulant but it make her more nervous and anxious.  She took Celexa for a while for help her anxiety but stopped working and switched to Prozac up to 80 mg with poor outcome.  Patient has history of depression, self abusive behavior.  In 2017 she superficially cut her leg and torso after broke up with the boyfriend.  Patient denies any psychiatric inpatient treatment or any suicidal attempt.  Past Medical History:  Past Medical History:   Diagnosis Date  . Allergy    Zyrtec PRN  . Anxiety   . Depression   . PCOS (polycystic ovarian syndrome) 2016    Past Surgical History:  Procedure Laterality Date  . EYE SURGERY     tear duct procedure  . WISDOM TOOTH EXTRACTION Bilateral 05/2017    Family Psychiatric History: Reviewed.  Family History:  Family History  Problem Relation Age of Onset  . Hearing loss Mother   . Bipolar disorder Mother   . Depression Mother   . Anxiety disorder Mother   . Bipolar disorder Brother   . Anxiety disorder Brother   . Cancer Maternal Grandmother        breast cancer  . Bipolar disorder Maternal Grandmother   . Depression Maternal Grandmother   . Hearing loss Maternal Grandfather   . Diabetes Father   . Anxiety disorder Father   . Deep vein thrombosis Father        After immobilization from surgery    Social History:  Social History   Socioeconomic History  . Marital status: Single    Spouse name: n/a  . Number of children: 0  . Years of education: college  . Highest education level: Not on file  Occupational History  . Occupation: singer  Social Needs  . Financial resource strain: Not very hard  . Food insecurity:    Worry: Never true  Inability: Never true  . Transportation needs:    Medical: No    Non-medical: Yes  Tobacco Use  . Smoking status: Never Smoker  . Smokeless tobacco: Never Used  Substance and Sexual Activity  . Alcohol use: Yes    Alcohol/week: 0.0 oz    Comment: social  . Drug use: Yes    Frequency: 2.0 times per week    Types: Marijuana  . Sexual activity: Yes    Partners: Male    Birth control/protection: Implant  Lifestyle  . Physical activity:    Days per week: 0 days    Minutes per session: 0 min  . Stress: Only a little  Relationships  . Social connections:    Talks on phone: Never    Gets together: Twice a week    Attends religious service: More than 4 times per year    Active member of club or organization: Yes     Attends meetings of clubs or organizations: More than 4 times per year    Relationship status: Never married  Other Topics Concern  . Not on file  Social History Narrative   Marital status: single; not dating seriously.     Children: none      Lives: with dad in 2018; parents separated in 2018.  Brothers go back and forth (23, 21, 19).      Employment:  Consulting civil engineer      Education: [employer] 01/2015; voice music major.       Tobacco: none      Alcohol: socially      Drugs: none      Sexual activity: dates females and males.    Allergies: No Known Allergies  Metabolic Disorder Labs: Lab Results  Component Value Date   HGBA1C 4.9 10/30/2016   MPG 88 01/05/2016   MPG 108 03/28/2014   Lab Results  Component Value Date   PROLACTIN 7.8 03/28/2014   PROLACTIN 8.8 02/24/2012   Lab Results  Component Value Date   CHOL 147 10/30/2016   TRIG 126 10/30/2016   HDL 75 10/30/2016   CHOLHDL 2.0 10/30/2016   VLDL 04.5 08/29/2014   LDLCALC 47 10/30/2016   LDLCALC 62 08/29/2014   Lab Results  Component Value Date   TSH 2.030 10/30/2016   TSH 1.66 01/05/2016    Therapeutic Level Labs: No results found for: LITHIUM No results found for: VALPROATE No components found for:  CBMZ  Current Medications: Current Outpatient Medications  Medication Sig Dispense Refill  . amoxicillin (AMOXIL) 500 MG capsule Take 1 capsule (500 mg total) by mouth 2 (two) times daily. 20 capsule 0  . benzonatate (TESSALON) 100 MG capsule Take 1-2 capsules (100-200 mg total) by mouth 3 (three) times daily as needed for cough. 40 capsule 0  . hydrOXYzine (ATARAX/VISTARIL) 10 MG tablet TAKE 1 TABLET BY MOUTH AS NEEDED FOR ANXIETY 30 tablet 0  . hydrOXYzine (ATARAX/VISTARIL) 10 MG tablet Take 1 tab as needed for anxiety 30 tablet 0  . ipratropium (ATROVENT) 0.03 % nasal spray Place 2 sprays into both nostrils 2 (two) times daily. 30 mL 0  . metFORMIN (GLUCOPHAGE-XR) 500 MG 24 hr tablet Take 4 tablets  (2,000 mg total) by mouth daily with breakfast. 360 tablet 1  . venlafaxine XR (EFFEXOR-XR) 37.5 MG 24 hr capsule TAKE TWO CAPSULES ( ) DAILY BY MOUTH 60 capsule 0  . venlafaxine XR (EFFEXOR-XR) 75 MG 24 hr capsule Take 1 capsule (75 mg total) by mouth daily with breakfast. Take two  capsules ( ) daily by mouth 30 capsule 1   Current Facility-Administered Medications  Medication Dose Route Frequency Provider Last Rate Last Dose  . etonogestrel (NEXPLANON) implant 68 mg  68 mg Subdermal Once Myles Lipps, MD         Musculoskeletal: Strength & Muscle Tone: within normal limits Gait & Station: normal Patient leans: N/A  Psychiatric Specialty Exam: ROS  Blood pressure 123/84, pulse (!) 105, height  (1.676 m), weight 224 lb 6.4 oz (101.8 kg), SpO2 98 %.There is no height or weight on file to calculate BMI.  General Appearance: Casual and Obese  Eye Contact:  Fair  Speech:  Slow  Volume:  Decreased  Mood:  Anxious and Dysphoric  Affect:  Congruent  Thought Process:  Goal Directed  Orientation:  Full (Time, Place, and Person)  Thought Content: Rumination   Suicidal Thoughts:  No  Homicidal Thoughts:  No  Memory:  Immediate;   Good Recent;   Good Remote;   Good  Judgement:  Good  Insight:  Good  Psychomotor Activity:  Decreased  Concentration:  Concentration: Fair and Attention Span: Fair  Recall:  Fair  Fund of Knowledge: Good  Language: Good  Akathisia:  No  Handed:  Right  AIMS (if indicated): not done  Assets:  Communication Skills Desire for Improvement Housing Resilience  ADL's:  Intact  Cognition: WNL  Sleep:  Fair   Screenings: GAD-7     Counselor from 06/20/2017 in BEHAVIORAL HEALTH PARTIAL HOSPITALIZATION PROGRAM Counselor from 06/03/2017 in BEHAVIORAL HEALTH PARTIAL HOSPITALIZATION PROGRAM Office Visit from 02/25/2017 in Primary Care at [facility] Visit from 10/30/2016 in Primary Care at [facility] Visit from 01/05/2016 in Primary Care at [facility]   Total GAD-7 Score  PHQ2-9     Counselor from 06/20/2017 in BEHAVIORAL HEALTH PARTIAL HOSPITALIZATION PROGRAM Office Visit from 06/16/2017 in Primary Care at [facility] Counselor from 06/13/2017 in BEHAVIORAL HEALTH PARTIAL HOSPITALIZATION PROGRAM Counselor from 06/03/2017 in BEHAVIORAL HEALTH PARTIAL HOSPITALIZATION PROGRAM Office Visit from 02/25/2017 in Primary Care at [facility] Total Score  2  0  PHQ-9 Total Score  7  -  Assessment and Plan: Generalized anxiety disorder.  Panic attacks.  Major depressive disorder, recurrent.  Reassurance given.  Discussed to try Effexor XR 150 mg daily to help her residual anxiety and depression.  Patient is excited for summer job.  I encouraged if she had a time then she should see a therapist there.  Discussed medication side effects and benefits.  Recommended to call us back if she has any question, concern if she feels worsening of the symptoms.  Follow-up in 3 months.   Cleotis Nipper, MD 07/17/2017, 2:02 PM

## 2017-09-06 ENCOUNTER — Other Ambulatory Visit (HOSPITAL_COMMUNITY): Payer: Self-pay | Admitting: Psychiatry

## 2017-09-06 DIAGNOSIS — F411 Generalized anxiety disorder: Secondary | ICD-10-CM

## 2017-09-25 ENCOUNTER — Ambulatory Visit (HOSPITAL_COMMUNITY): Payer: Self-pay | Admitting: Licensed Clinical Social Worker

## 2017-09-25 ENCOUNTER — Encounter

## 2017-10-28 ENCOUNTER — Encounter: Payer: Self-pay | Admitting: Physician Assistant

## 2017-10-28 ENCOUNTER — Ambulatory Visit (INDEPENDENT_AMBULATORY_CARE_PROVIDER_SITE_OTHER): Payer: Self-pay | Admitting: Physician Assistant

## 2017-10-28 ENCOUNTER — Other Ambulatory Visit: Payer: Self-pay

## 2017-10-28 VITALS — BP 126/84 | HR 110 | Temp 98.9°F | Resp 20 | Ht 66.34 in | Wt 222.4 lb

## 2017-10-28 DIAGNOSIS — J029 Acute pharyngitis, unspecified: Secondary | ICD-10-CM

## 2017-10-28 DIAGNOSIS — R05 Cough: Secondary | ICD-10-CM

## 2017-10-28 DIAGNOSIS — R059 Cough, unspecified: Secondary | ICD-10-CM

## 2017-10-28 DIAGNOSIS — J020 Streptococcal pharyngitis: Secondary | ICD-10-CM

## 2017-10-28 LAB — POCT RAPID STREP A (OFFICE): Rapid Strep A Screen: POSITIVE — AB

## 2017-10-28 MED ORDER — BENZONATATE 100 MG PO CAPS: 100.0000 mg | ORAL_CAPSULE | Freq: Three times a day (TID) | ORAL | 0 refills | Status: AC | PRN

## 2017-10-28 MED ORDER — AMOXICILLIN 500 MG PO CAPS: 500.0000 mg | ORAL_CAPSULE | Freq: Two times a day (BID) | ORAL | 0 refills | Status: AC

## 2017-10-28 NOTE — Progress Notes (Signed)
MRN: [id number] DOB: Sep 04, 1990  Subjective:   Sherry Mercado is a 27 y.o. female presenting for chief complaint of Sore Throat (X 1 week) and Nasal Congestion (X 1 week - head congestion) .  Reports 1 week history of sore throat and head congestion. Now the drainage is causing dry cough. Has been taking dayquil, nyquil, and sudafed, which has helped.  Denies fever, ear pain, wheezing, shortness of breath, chest pain and myalgia, nausea, vomiting, abdominal pain and diarrhea. Has had sick contact with boyfriend.  No history of seasonal allergies, no history of asthma. Patient has not had flu shot this season. Denies smoking. Denies any other aggravating or relieving factors, no other questions or concerns.Denies any other aggravating or relieving factors, no other questions or concerns.  [name] has a current medication list which includes the following prescription(s): hydroxyzine, metformin, venlafaxine xr, ipratropium, and venlafaxine xr, and the following Facility-Administered Medications: etonogestrel. Also has No Known Allergies.  Sherry Mercado  has a past medical history of Allergy, Anxiety, Depression, and PCOS (polycystic ovarian syndrome) (2016). Also  has a past surgical history that includes Eye surgery and Wisdom tooth extraction (Bilateral, 05/2017).   Objective:   Vitals: BP 126/84   Pulse (!) 110   Temp 98.9 F (37.2 C) (Oral)   Resp 20   Ht 5' 6.34" (1.685 m)   Wt 222 lb 6.4 oz (100.9 kg)   LMP 03/30/2017 (Approximate)   SpO2 97%   BMI 35.53 kg/m   Physical Exam  Constitutional: She is oriented to person, place, and time. She appears well-developed and well-nourished.  HENT:  Head: Normocephalic and atraumatic.  Right Ear: Tympanic membrane, external ear and ear canal normal.  Left Ear: Tympanic membrane, external ear and ear canal normal.  Nose: Mucosal edema present. Right sinus exhibits no maxillary sinus tenderness and no frontal sinus tenderness. Left sinus  exhibits no maxillary sinus tenderness and no frontal sinus tenderness.  Mouth/Throat: Uvula is midline and mucous membranes are normal. No trismus in the jaw. Posterior oropharyngeal erythema present. No posterior oropharyngeal edema or tonsillar abscesses. Tonsils are 2+ on the right. Tonsils are 2+ on the left. No tonsillar exudate.  Tonsils are erythematous bilaterally.  Eyes: Conjunctivae are normal.  Neck: Normal range of motion.  Cardiovascular: Normal rate, regular rhythm, normal heart sounds and intact distal pulses.  Pulmonary/Chest: Effort normal and breath sounds normal. She has no decreased breath sounds. She has no wheezes. She has no rhonchi. She has no rales.  Lymphadenopathy:       Head (right side): No submental, no submandibular, no tonsillar, no preauricular, no posterior auricular and no occipital adenopathy present.       Head (left side): No submental, no submandibular, no tonsillar, no preauricular, no posterior auricular and no occipital adenopathy present.    She has cervical adenopathy.       Right cervical: Posterior cervical adenopathy present.       Left cervical: Posterior cervical adenopathy present.       Right: No supraclavicular adenopathy present.       Left: No supraclavicular adenopathy present.  Neurological: She is alert and oriented to person, place, and time.  Skin: Skin is warm and dry.  Psychiatric: She has a normal mood and affect.  Vitals reviewed.   Results for orders placed or performed in visit on 10/28/17 (from the past 24 hour(s))  POCT rapid strep A     Status: Abnormal   Collection Time: 10/28/17  3:55  PM  Result Value Ref Range   Rapid Strep A Screen Positive (A) Negative    Assessment and Plan :  1. Streptococcal sore throat Rapid strep test positive for strep A.  Will treat with antibiotics at this time.  Recommended over-the-counter ibuprofen as prescribed as needed for pain.  Advised to return to clinic if symptoms worsen, do not  improve, or as needed.  - amoxicillin (AMOXIL) 500 MG capsule; Take 1 capsule (500 mg total) by mouth 2 (two) times daily.  Dispense: 20 capsule; Refill: 0  2. Sore throat - POCT rapid strep A  3. Cough - benzonatate (TESSALON) 100 MG capsule; Take 1-2 capsules (100-200 mg total) by mouth 3 (three) times daily as needed for cough.  Dispense: 40 capsule; Refill: 0   Benjiman Core, PA-C  [facility] 10/28/2017 4:06 PM

## 2017-10-28 NOTE — Patient Instructions (Addendum)
-If tested positive for strep throat.  I recommend taking antibiotic until its complete. - I recommend you rest, drink plenty of fluids, eat light meals including soups.  - You may use Tessalon Perles for cough and nasal spray for nasal congestion. - You may also use Tylenol or ibuprofen over-the-counter for your sore throat. Tea recipe for sore throat: boil water, add 2 inches shaved ginger root, steep 15 minutes, add juice from 2 full lemons, and 2 tbsp honey. - Please let me know if you are not seeing any improvement or get worse in 10 to 12 days.      If you have lab work done today you will be contacted with your lab results within the next 2 weeks.  If you have not heard from [place] then please contact us. The fastest way to get your results is to register for My Chart.   Strep Throat Strep throat is an infection of the throat. It is caused by germs. Strep throat spreads from person to person because of coughing, sneezing, or close contact. Follow these instructions at home: Medicines  Take over-the-counter and prescription medicines only as told by your doctor.  Take your antibiotic medicine as told by your doctor. Do not stop taking the medicine even if you feel better.  Have family members who also have a sore throat or fever go to a doctor. Eating and drinking  Do not share food, drinking cups, or personal items.  Try eating soft foods until your sore throat feels better.  Drink enough fluid to keep your pee (urine) clear or pale yellow. General instructions  Rinse your mouth (gargle) with a salt-water mixture 3-4 times per day or as needed. To make a salt-water mixture, stir -1 tsp of salt into 1 cup of warm water.  Make sure that all people in your house wash their hands well.  Rest.  Stay home from school or work until you have been taking antibiotics for 24 hours.  Keep all follow-up visits as told by your doctor. This is important. Contact a doctor if:  Your  neck keeps getting bigger.  You get a rash, cough, or earache.  You cough up thick liquid that is green, yellow-brown, or bloody.  You have pain that does not get better with medicine.  Your problems get worse instead of getting better.  You have a fever. Get help right away if:  You throw up (vomit).  You get a very bad headache.  You neck hurts or it feels stiff.  You have chest pain or you are short of breath.  You have drooling, very bad throat pain, or changes in your voice.  Your neck is swollen or the skin gets red and tender.  Your mouth is dry or you are peeing less than normal.  You keep feeling more tired or it is hard to wake up.  Your joints are red or they hurt. This information is not intended to replace advice given to you by your health care provider. Make sure you discuss any questions you have with your health care provider. Document Released: 07/24/2007 Document Revised: 10/04/2015 Document Reviewed: 05/30/2014 Elsevier Interactive Patient Education  2018 [employer].  IF you received an x-ray today, you will receive an invoice from [facility]. Please contact [facility] at [phone] with questions or concerns regarding your invoice.   IF you received labwork today, you will receive an invoice from [facility]. Please contact [facility] at [phone] with questions or concerns  regarding your invoice.   Our billing staff will not be able to assist you with questions regarding bills from these companies.  You will be contacted with the lab results as soon as they are available. The fastest way to get your results is to activate your My Chart account. Instructions are located on the last page of this paperwork. If you have not heard from [place] regarding the results in 2 weeks, please contact this office.

## 2017-11-30 ENCOUNTER — Other Ambulatory Visit: Payer: Self-pay | Admitting: Physician Assistant

## 2017-11-30 DIAGNOSIS — E282 Polycystic ovarian syndrome: Secondary | ICD-10-CM

## 2017-12-01 NOTE — Telephone Encounter (Signed)
Requested medication (s) are due for refill today: Yes  Requested medication (s) are on the active medication list: Yes  Last refill:  12/25/2016  Future visit scheduled: No  Notes to clinic:  Unable to refill per protocol, abnormal/overdue labs     Requested Prescriptions  Pending Prescriptions Disp Refills   metFORMIN (GLUCOPHAGE-XR) 500 MG 24 hr tablet [Pharmacy Med Name: METFORMIN HCL ER 500 MG TABLET] 360 tablet 1    Sig: Take 4 tablets (2,000 mg total) by mouth daily with breakfast.     Endocrinology:  Diabetes - Biguanides Failed - 11/30/2017 11:04 AM      Failed - Cr in normal range and within 360 days    Creat  Date Value Ref Range Status  01/05/2016 0.62 0.50 - 1.10 mg/dL Final   Creatinine, Ser  Date Value Ref Range Status  10/30/2016 0.61 0.57 - 1.00 mg/dL Final         Failed - HBA1C is between 0 and 7.9 and within 180 days    Hgb A1c MFr Bld  Date Value Ref Range Status  10/30/2016 4.9 4.8 - 5.6 % Final    Comment:             Prediabetes: 5.7 - 6.4          Diabetes: >6.4          Glycemic control for adults with diabetes: <7.0          Failed - eGFR in normal range and within 360 days    GFR calc Af Amer  Date Value Ref Range Status  10/30/2016 146 >59 mL/min/1.73 Final   GFR calc non Af Amer  Date Value Ref Range Status  10/30/2016 126 >59 mL/min/1.73 Final         Passed - Valid encounter within last 6 months    Recent Outpatient Visits          1 month ago Streptococcal sore throat   [facility], [place] D, PA-C   5 months ago Streptococcal sore throat   [facility] [place], [place] D, PA-C   8 months ago Nexplanon insertion   [facility], Lilia Argue, MD   9 months ago Encounter for contraceptive management, unspecified type   [facility] [place], [place] D, PA-C   11 months ago PCOS (polycystic ovarian syndrome)   [facility] at [facility], Gelene Mink, [place]

## 2017-12-20 ENCOUNTER — Other Ambulatory Visit (HOSPITAL_COMMUNITY): Payer: Self-pay | Admitting: Psychiatry

## 2017-12-20 DIAGNOSIS — F411 Generalized anxiety disorder: Secondary | ICD-10-CM

## 2017-12-31 ENCOUNTER — Other Ambulatory Visit: Payer: Self-pay | Admitting: Physician Assistant

## 2017-12-31 DIAGNOSIS — E282 Polycystic ovarian syndrome: Secondary | ICD-10-CM

## 2017-12-31 NOTE — Telephone Encounter (Signed)
Requested Prescriptions  Pending Prescriptions Disp Refills  . metFORMIN (GLUCOPHAGE-XR) 500 MG 24 hr tablet [Pharmacy Med Name: METFORMIN HCL ER 500 MG TABLET] 360 tablet 0    Sig: TAKE 4 TABLETS (2,000 MG TOTAL) BY MOUTH DAILY WITH BREAKFAST.     Endocrinology:  Diabetes - Biguanides Failed - 12/31/2017  9:29 AM      Failed - Cr in normal range and within 360 days    Creat  Date Value Ref Range Status  01/05/2016 0.62 0.50 - 1.10 mg/dL Final   Creatinine, Ser  Date Value Ref Range Status  10/30/2016 0.61 0.57 - 1.00 mg/dL Final         Failed - HBA1C is between 0 and 7.9 and within 180 days    Hgb A1c MFr Bld  Date Value Ref Range Status  10/30/2016 4.9 4.8 - 5.6 % Final    Comment:             Prediabetes: 5.7 - 6.4          Diabetes: >6.4          Glycemic control for adults with diabetes: <7.0          Failed - eGFR in normal range and within 360 days    GFR calc Af Amer  Date Value Ref Range Status  10/30/2016 146 >59 mL/min/1.73 Final   GFR calc non Af Amer  Date Value Ref Range Status  10/30/2016 126 >59 mL/min/1.73 Final         Passed - Valid encounter within last 6 months    Recent Outpatient Visits          2 months ago Streptococcal sore throat   [facility], Brittany D, PA-C   6 months ago Streptococcal sore throat   [facility], Brittany D, PA-C   9 months ago Nexplanon insertion   [facility] [place], Irma M, MD   10 months ago Encounter for contraceptive management, unspecified type   [facility], Brittany D, PA-C   1 year ago PCOS (polycystic ovarian syndrome)   [facility], Elizabeth Whitney, PA-C             

## 2018-01-14 ENCOUNTER — Other Ambulatory Visit (HOSPITAL_COMMUNITY): Payer: Self-pay | Admitting: Psychiatry

## 2018-01-14 DIAGNOSIS — F411 Generalized anxiety disorder: Secondary | ICD-10-CM

## 2018-01-19 ENCOUNTER — Other Ambulatory Visit (HOSPITAL_COMMUNITY): Payer: Self-pay | Admitting: Psychiatry

## 2018-01-19 DIAGNOSIS — F411 Generalized anxiety disorder: Secondary | ICD-10-CM

## 2018-02-25 ENCOUNTER — Telehealth: Payer: Self-pay | Admitting: Family Medicine

## 2018-02-25 NOTE — Telephone Encounter (Signed)
MyChart message sent to pt about their appointment with Dr Shaw on 03/12/18 °

## 2018-03-12 ENCOUNTER — Encounter

## 2018-03-12 ENCOUNTER — Ambulatory Visit: Payer: Self-pay | Admitting: Family Medicine

## 2019-05-18 IMAGING — DX DG CHEST 2V
2 series · 2 of 2 positions shown · non-contrast
Comparison: 02/24/2012 chest radiograph.

CLINICAL DATA: 26 y/o  F; 5 days of productive cough with fever.

EXAM:
CHEST - 2 VIEW

[chest pa]
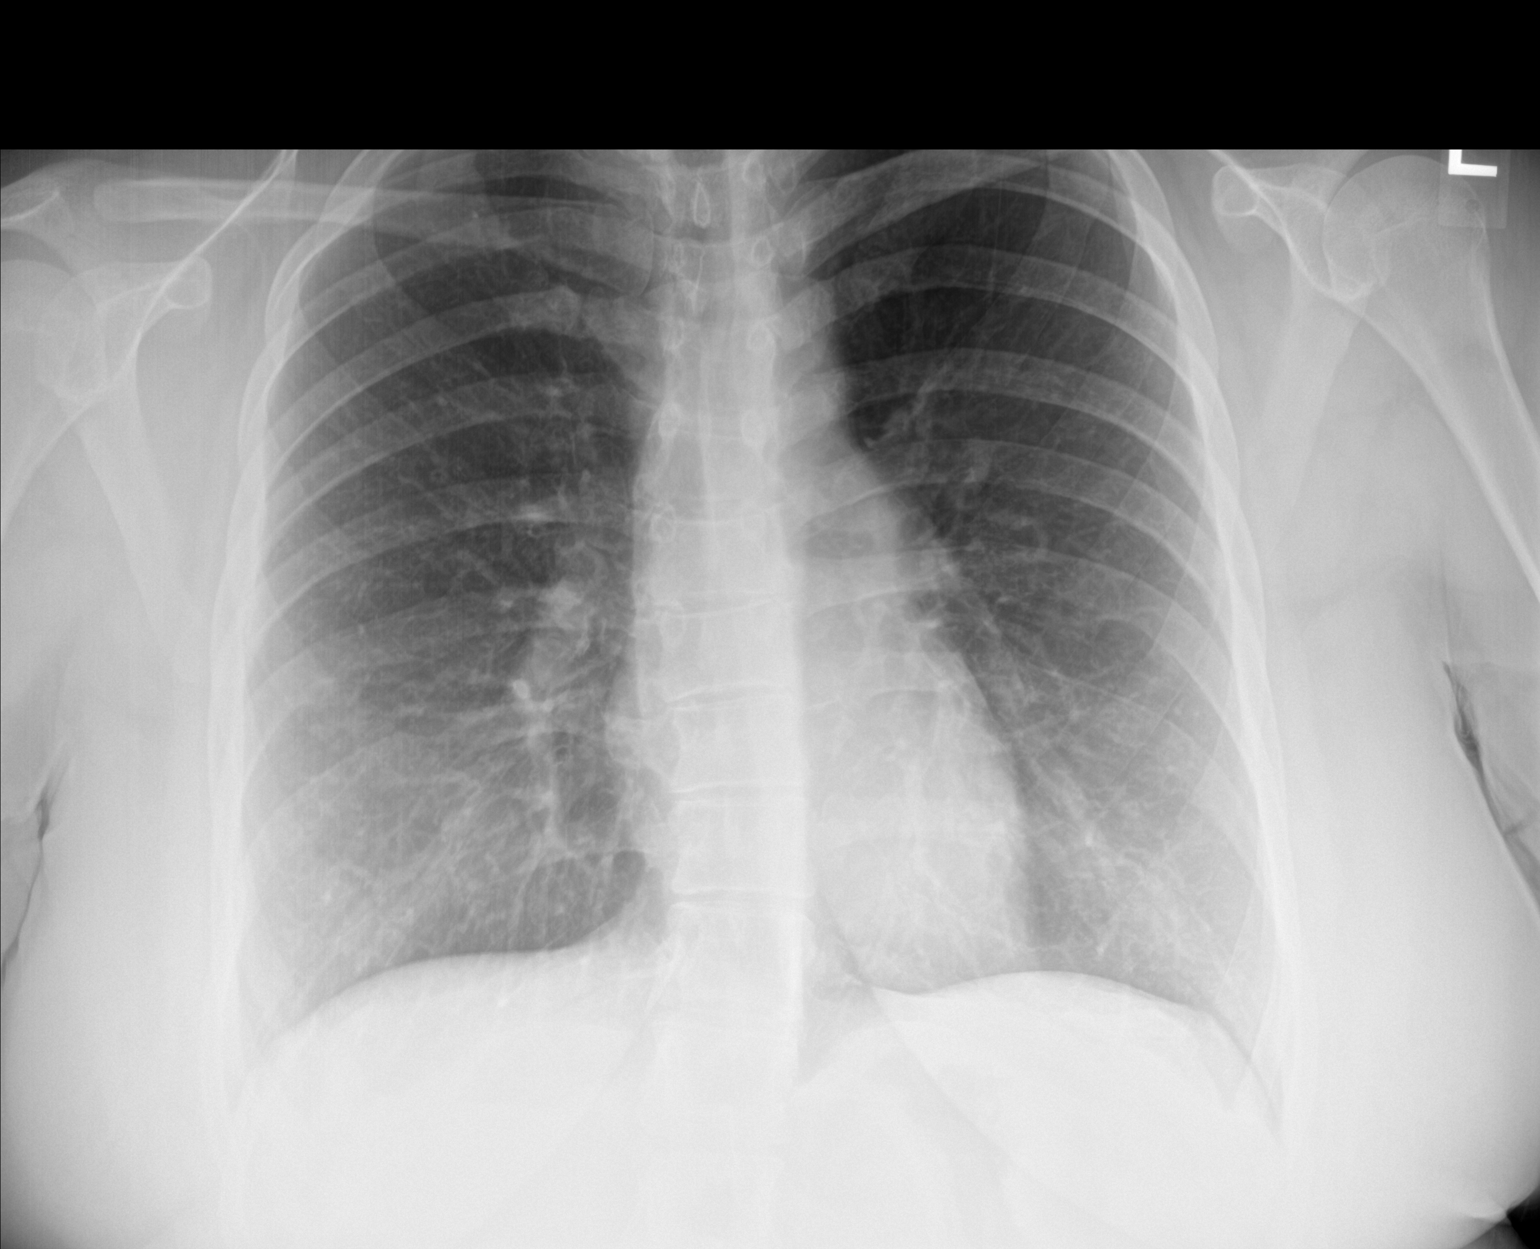

[chest lat]
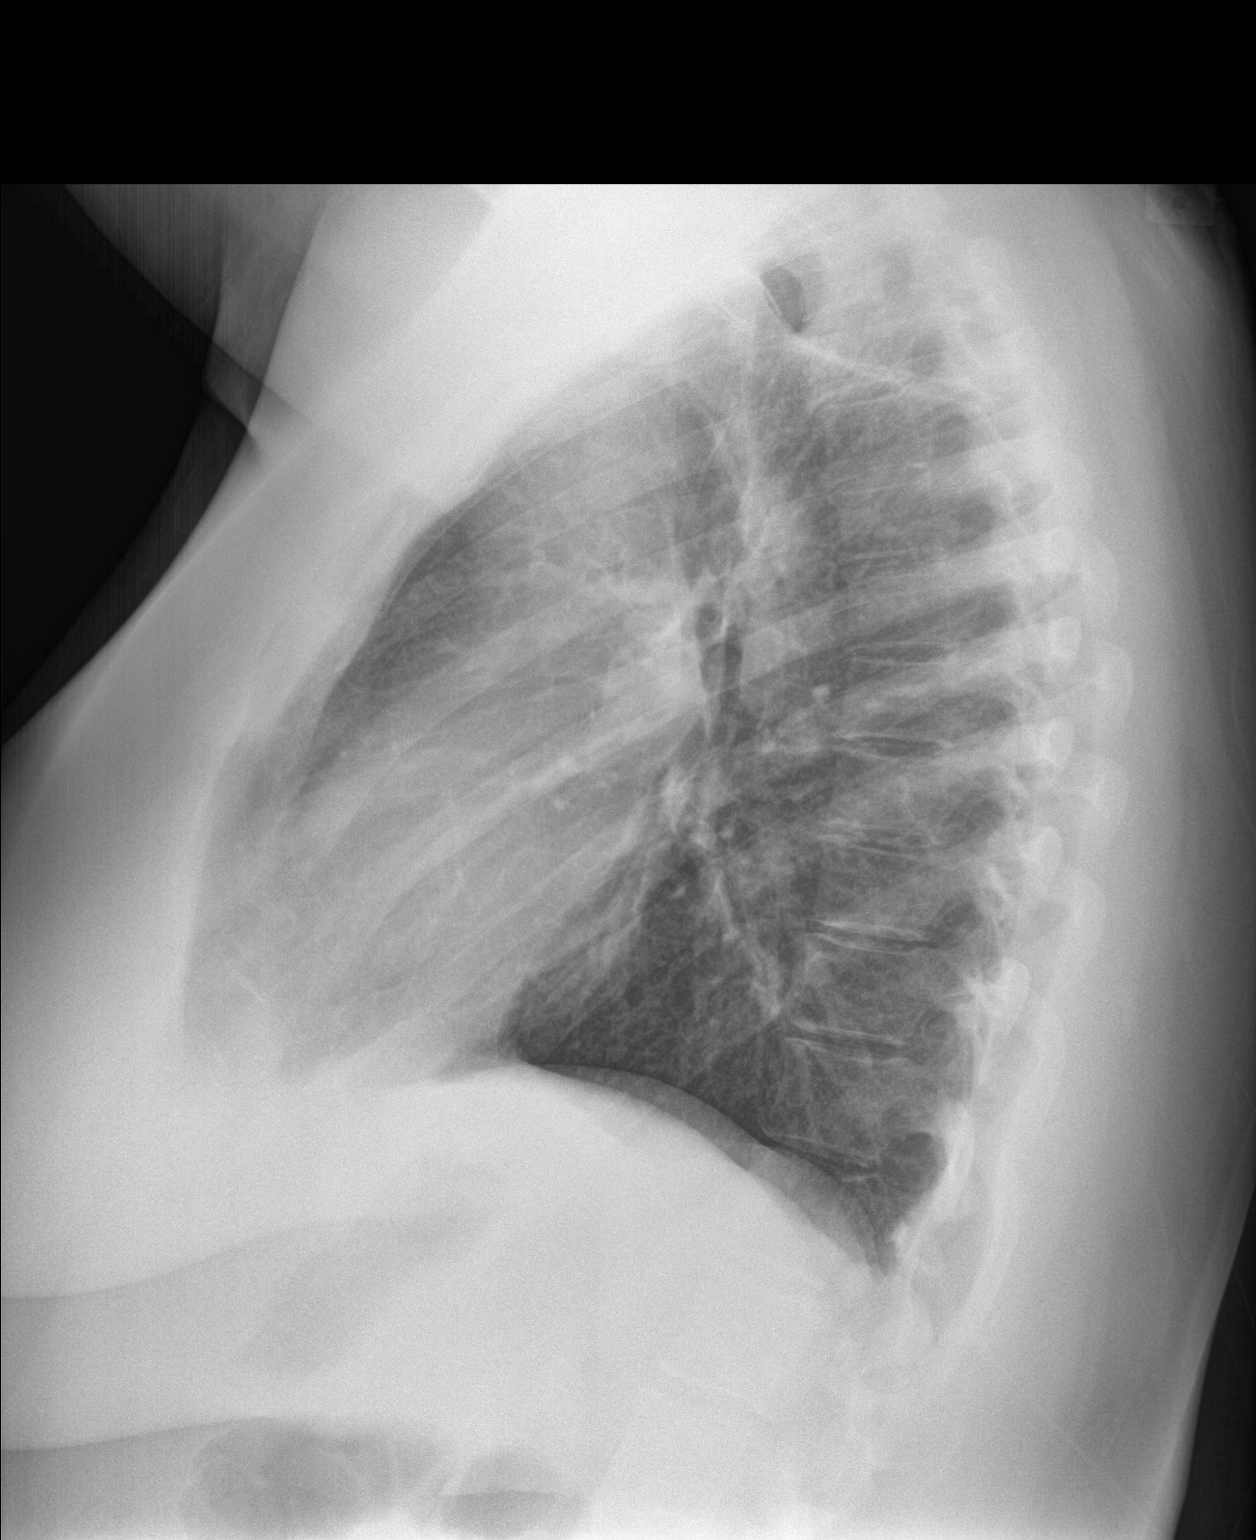

[2 of 2 positions shown; findings below may reference images not displayed]

FINDINGS: Stable heart size and mediastinal contours are within normal limits.
Both lungs are clear. The visualized skeletal structures are
unremarkable.
IMPRESSION: No active cardiopulmonary disease.

By: Odd Steinar Vasli M.D.

## 2020-04-07 ENCOUNTER — Other Ambulatory Visit: Payer: Self-pay

## 2020-04-07 ENCOUNTER — Ambulatory Visit (INDEPENDENT_AMBULATORY_CARE_PROVIDER_SITE_OTHER): Payer: Self-pay | Admitting: Registered Nurse

## 2020-04-07 ENCOUNTER — Encounter: Payer: Self-pay | Admitting: Registered Nurse

## 2020-04-07 VITALS — BP 102/72 | HR 107 | Temp 98.7°F | Resp 18 | Ht 66.0 in | Wt 232.2 lb

## 2020-04-07 DIAGNOSIS — Z3046 Encounter for surveillance of implantable subdermal contraceptive: Secondary | ICD-10-CM

## 2021-01-15 ENCOUNTER — Ambulatory Visit (HOSPITAL_COMMUNITY)
Admission: EM | Admit: 2021-01-15 | Discharge: 2021-01-15 | Disposition: A | Discharge status: Home / Self Care | Payer: Self-pay | Attending: Sports Medicine | Admitting: Sports Medicine

## 2021-01-15 ENCOUNTER — Encounter (HOSPITAL_COMMUNITY): Payer: Self-pay

## 2021-01-15 ENCOUNTER — Other Ambulatory Visit: Payer: Self-pay

## 2021-01-15 DIAGNOSIS — U071 COVID-19: Secondary | ICD-10-CM

## 2021-01-15 DIAGNOSIS — R0789 Other chest pain: Secondary | ICD-10-CM

## 2021-01-15 DIAGNOSIS — R062 Wheezing: Secondary | ICD-10-CM

## 2021-01-15 MED ORDER — PREDNISONE 20 MG PO TABS: 40.0000 mg | ORAL_TABLET | Freq: Every day | ORAL | 0 refills | Status: AC

## 2021-01-15 NOTE — Discharge Instructions (Addendum)
Begin prednisone, 2 tablets once a day for the next 5 days.  This should calm down your cough and chest discomfort/breathing issue  Over the next few days, be aware if you are having any new onset fevers, worsening chest pain or shortness of breath.  If any of these are present, would recommend returning back to the urgent care or the emergency room.  Symptoms of cough, nasal congestion can last for a few weeks, although symptoms continue to take a turn for the worse, recommend following up with her primary care physician.

## 2021-01-15 NOTE — ED Provider Notes (Signed)
[facility]    CSN: [id number] Arrival date & time: 01/15/21  1850      History   Chief Complaint Chief Complaint  Patient presents with   Covid +    HPI Sherry Mercado is a 30 y.o. female here for known COVID-19 positive with increased cough and chest discomfort.  HPI  Covid + on 01/10/21 and first day of symptoms (home test). 2 home tests were positive.  Patient states she appropriately quarantine and started feeling better by Friday, however on Saturday she felt like her symptoms began to worsen again.  More so she had increased cough with newer sputum production on Saturday.  The mucus was yellow-green and somewhat thicker.  Initially had fever and chills on Wednesday and Thursday but has not had any fever since that time.  Initial sore throat, but this is also improving.  The reason she presents today is because starting on Saturday she began having a worsening somewhat productive cough and had some chest discomfort in the middle of her chest.  She does have a history of asthma when she was younger and exercise-induced asthma and recently felt like her breathing was not completely normal.  The discomfort in her chest does worsen with cough and increased deep breathing.  She has been taking DayQuil and NyQuil, Mucinex as well with only mild relief.   Past Medical History:  Diagnosis Date   Allergy    Zyrtec PRN   Anxiety    Depression    PCOS (polycystic ovarian syndrome) 2016    Patient Active Problem List   Diagnosis Date Noted   PCOS (polycystic ovarian syndrome) 05/24/2015   Anxiety state 09/18/2006   ADHD 06/12/2006    Past Surgical History:  Procedure Laterality Date   EYE SURGERY     tear duct procedure   WISDOM TOOTH EXTRACTION Bilateral 05/2017    OB History   No obstetric history on file.      Home Medications    Prior to Admission medications   Medication Sig Start Date End Date Taking? Authorizing Provider  predniSONE (DELTASONE)  20 MG tablet Take 2 tablets (40 mg total) by mouth daily with breakfast for 5 days. 01/15/21 01/20/21 Yes Madelyn Brunner, DO  amoxicillin (AMOXIL) 500 MG capsule Take 1 capsule (500 mg total) by mouth 2 (two) times daily. Patient not taking: Reported on 04/07/2020 10/28/17   Benjiman Core D, PA-C  benzonatate (TESSALON) 100 MG capsule Take 1-2 capsules (100-200 mg total) by mouth 3 (three) times daily as needed for cough. Patient not taking: Reported on 04/07/2020 10/28/17   Benjiman Core D, PA-C  hydrOXYzine (ATARAX/VISTARIL) 10 MG tablet TAKE 1 TABLET BY MOUTH AS NEEDED FOR ANXIETY 07/17/17   Arfeen, Phillips Grout, MD  ipratropium (ATROVENT) 0.03 % nasal spray Place 2 sprays into both nostrils 2 (two) times daily. Patient not taking: No sig reported 06/16/17   Benjiman Core D, PA-C  metFORMIN (GLUCOPHAGE-XR) 500 MG 24 hr tablet TAKE 4 TABLETS (2,000 MG TOTAL) BY MOUTH DAILY WITH BREAKFAST. 12/31/17   Benjiman Core D, PA-C  venlafaxine XR (EFFEXOR-XR) 150 MG 24 hr capsule Take 1 capsule (150 mg total) by mouth daily with breakfast. Patient not taking: Reported on 04/07/2020 12/23/17   Arfeen, Phillips Grout, MD    Family History Family History  Problem Relation Age of Onset   Hearing loss Mother    Bipolar disorder Mother    Depression Mother    Anxiety disorder Mother    Bipolar  disorder Brother    Anxiety disorder Brother    Cancer Maternal Grandmother        breast cancer   Bipolar disorder Maternal Grandmother    Depression Maternal Grandmother    Hearing loss Maternal Grandfather    Diabetes Father    Anxiety disorder Father    Deep vein thrombosis Father        After immobilization from surgery    Social History Social History   Tobacco Use   Smoking status: Never   Smokeless tobacco: Never  Vaping Use   Vaping Use: Never used  Substance Use Topics   Alcohol use: Yes    Alcohol/week: 0.0 standard drinks    Comment: social   Drug use: Yes    Frequency: 2.0 times per week     Types: Marijuana     Allergies   Patient has no known allergies.   Review of Systems Review of Systems  Constitutional:  Negative for chills and fever.  HENT:  Positive for congestion and rhinorrhea.   Respiratory:  Positive for cough and chest tightness.   Gastrointestinal:  Negative for abdominal pain.  Musculoskeletal:  Negative for neck stiffness.  Neurological:  Positive for headaches.    Physical Exam Triage Vital Signs ED Triage Vitals  Enc Vitals Group     BP 01/15/21 2003 125/88     Pulse Rate 01/15/21 2003 72     Resp 01/15/21 2003 17     Temp 01/15/21 2003 98.1 F (36.7 C)     Temp Source 01/15/21 2003 [facility]     SpO2 01/15/21 2003 98 %     Weight --      Height --      Head Circumference --      Peak Flow --      Pain Score 01/15/21 2001 5     Pain Loc --      Pain Edu? --      Excl. in GC? --    No data found.  Updated Vital Signs BP 125/88 (BP Location: Left Arm)   Pulse 72   Temp 98.1 F (36.7 C) (Oral)   Resp 17   SpO2 98%    Physical Exam Constitutional:      General: She is not in acute distress.    Appearance: Normal appearance. She is ill-appearing. She is not toxic-appearing.  HENT:     Head: Normocephalic and atraumatic.     Right Ear: Ear canal and external ear normal.     Left Ear: Ear canal and external ear normal.     Nose: Rhinorrhea present.     Mouth/Throat:     Mouth: Mucous membranes are moist.     Pharynx: No oropharyngeal exudate or posterior oropharyngeal erythema.  Eyes:     Conjunctiva/sclera: Conjunctivae normal.     Pupils: Pupils are equal, round, and reactive to light.  Cardiovascular:     Rate and Rhythm: Normal rate.     Heart sounds: No murmur heard.   No friction rub. No gallop.  Pulmonary:     Effort: Pulmonary effort is normal. No respiratory distress.     Breath sounds: Wheezing (+ faint end-expiratory wheezing b/l in bases, but full breath sounds throughout) present. No rhonchi or rales.  Abdominal:      General: Abdomen is flat.  Musculoskeletal:     Cervical back: Normal range of motion and neck supple.  Lymphadenopathy:     Cervical: Cervical adenopathy present.  Skin:  General: Skin is warm.     Capillary Refill: Capillary refill takes less than 2 seconds.  Neurological:     General: No focal deficit present.     Mental Status: She is alert.  Psychiatric:        Mood and Affect: Mood normal.     UC Treatments / Results  Labs (all labs ordered are listed, but only abnormal results are displayed) Labs Reviewed - No data to display  EKG   Radiology No results found.  Procedures Procedures (including critical care time)  Medications Ordered in UC Medications - No data to display  Initial Impression / Assessment and Plan / UC Course  I have reviewed the triage vital signs and the nursing notes.  Pertinent labs & imaging results that were available during my care of the patient were reviewed by me and considered in my medical decision making (see chart for details).     COVID-19 disease Wheezing Chest discomfort  Patient is known COVID-positive with first day of symptoms and positive test on 01/10/2021.  Initially began feeling improved over the weekend, then had increased sputum production, worsening cough and some mid chest discomfort.  Chest pain does not worsen with exertion or positional changes.  It does worsen with coughing and deep breathing.  History of remote asthma and EIB.  Mild and expiratory wheezing noted on exam.  No evidence of bacterial infection in the sinuses, throat or no focal rales or rhonchi throughout lung exam.  Given this, I believe she is dealing with resolving COVID-19, but then also some exacerbation of airway constriction given her virus.  We will treat with prednisone 40 mg x 5 days.  She will continue with supportive treatment as well, over-the-counter Tylenol or Motrin for any irritability or pain.  I did discuss strict return  precautions if she would have any new onset fevers, any worsening shortness of breath or development of chest pain--any of the symptoms would warrant reevaluation to the urgent care or ED.  However, I feel her symptoms were improved with the steroid to calm down her lung inflammation.  Patient is agreeable to this plan and understanding.  She should otherwise follow-up with her PCP. Final Clinical Impressions(s) / UC Diagnoses   Final diagnoses:  COVID-19  Wheezing  Chest discomfort     Discharge Instructions      Begin prednisone, 2 tablets once a day for the next 5 days.  This should calm down your cough and chest discomfort/breathing issue  Over the next few days, be aware if you are having any new onset fevers, worsening chest pain or shortness of breath.  If any of these are present, would recommend returning back to the urgent care or the emergency room.  Symptoms of cough, nasal congestion can last for a few weeks, although symptoms continue to take a turn for the worse, recommend following up with her primary care physician.     ED Prescriptions     Medication Sig Dispense Auth. Provider   predniSONE (DELTASONE) 20 MG tablet Take 2 tablets (40 mg total) by mouth daily with breakfast for 5 days. 10 tablet Madelyn Brunner, DO      PDMP not reviewed this encounter.   Madelyn Brunner, DO 01/15/21 2123

## 2021-01-15 NOTE — ED Triage Notes (Signed)
Pt presents covid + as of Wednesday 01/10/2021; pt states she has been having a thick productive cough and chest pain X 2 days.

## 2021-03-08 ENCOUNTER — Encounter: Payer: Self-pay | Admitting: Registered Nurse

## 2021-03-08 NOTE — Progress Notes (Signed)
Established Patient Office Visit  Subjective:  Patient ID: Sherry Mercado, female    DOB: [birth year]  Age: 31 y.o. MRN: [id number]  CC:  Chief Complaint  Patient presents with   Contraception    Patient states she would like to get Nexplanon removed after 3 years.    HPI Sherry Mercado presents for nexplanon removal  Inserted 3 years ago. No AE.  Does not want this replaced. No plans for contraception at this time.  No other concerns today.   Past Medical History:  Diagnosis Date   Allergy    Zyrtec PRN   Anxiety    Depression    PCOS (polycystic ovarian syndrome) 2016    Past Surgical History:  Procedure Laterality Date   EYE SURGERY     tear duct procedure   WISDOM TOOTH EXTRACTION Bilateral 05/2017    Family History  Problem Relation Age of Onset   Hearing loss Mother    Bipolar disorder Mother    Depression Mother    Anxiety disorder Mother    Bipolar disorder Brother    Anxiety disorder Brother    Cancer Maternal Grandmother        breast cancer   Bipolar disorder Maternal Grandmother    Depression Maternal Grandmother    Hearing loss Maternal Grandfather    Diabetes Father    Anxiety disorder Father    Deep vein thrombosis Father        After immobilization from surgery    Social History   Socioeconomic History   Marital status: Single    Spouse name: n/a   Number of children: 0   Years of education: college   Highest education level: Not on file  Occupational History   Occupation: singer  Tobacco Use   Smoking status: Never   Smokeless tobacco: Never  Vaping Use   Vaping Use: Never used  Substance and Sexual Activity   Alcohol use: Yes    Alcohol/week: 0.0 standard drinks    Comment: social   Drug use: Yes    Frequency: 2.0 times per week    Types: Marijuana   Sexual activity: Yes    Partners: Male    Birth control/protection: Implant  Other Topics Concern   Not on file  Social History Narrative   Marital status:  single; not dating seriously.     Children: none      Lives: with dad in 2018; parents separated in 2018.  Brothers go back and forth (23, 21, 19).      Employment:  Consulting civil engineer      Education: [employer] 01/2015; voice music major.       Tobacco: none      Alcohol: socially      Drugs: none      Sexual activity: dates females and males.   Social Determinants of Health   Financial Resource Strain: Not on file  Food Insecurity: Not on file  Transportation Needs: Not on file  Physical Activity: Not on file  Stress: Not on file  Social Connections: Not on file  Intimate Partner Violence: Not on file    Outpatient Medications Prior to Visit  Medication Sig Dispense Refill   hydrOXYzine (ATARAX/VISTARIL) 10 MG tablet TAKE 1 TABLET BY MOUTH AS NEEDED FOR ANXIETY 30 tablet 0   metFORMIN (GLUCOPHAGE-XR) 500 MG 24 hr tablet TAKE 4 TABLETS (2,000 MG TOTAL) BY MOUTH DAILY WITH BREAKFAST. 360 tablet 0   amoxicillin (AMOXIL) 500 MG capsule Take 1 capsule (500 mg total)  by mouth 2 (two) times daily. (Patient not taking: Reported on 04/07/2020) 20 capsule 0   benzonatate (TESSALON) 100 MG capsule Take 1-2 capsules (100-200 mg total) by mouth 3 (three) times daily as needed for cough. (Patient not taking: Reported on 04/07/2020) 40 capsule 0   ipratropium (ATROVENT) 0.03 % nasal spray Place 2 sprays into both nostrils 2 (two) times daily. (Patient not taking: No sig reported) 30 mL 0   venlafaxine XR (EFFEXOR-XR) 150 MG 24 hr capsule Take 1 capsule (150 mg total) by mouth daily with breakfast. (Patient not taking: Reported on 04/07/2020) 30 capsule 0   Facility-Administered Medications Prior to Visit  Medication Dose Route Frequency Provider Last Rate Last Admin   etonogestrel (NEXPLANON) implant 68 mg  68 mg Subdermal Once [place], Meda Coffee, MD        No Known Allergies  ROS Review of Systems Per hpi    Objective:    Physical Exam Vitals and nursing note reviewed.  Constitutional:       General: She is not in acute distress.    Appearance: Normal appearance. She is normal weight. She is not ill-appearing, toxic-appearing or diaphoretic.  Cardiovascular:     Rate and Rhythm: Normal rate and regular rhythm.     Heart sounds: Normal heart sounds. No murmur heard.   No friction rub. No gallop.  Pulmonary:     Effort: Pulmonary effort is normal. No respiratory distress.     Breath sounds: Normal breath sounds. No stridor. No wheezing, rhonchi or rales.  Chest:     Chest wall: No tenderness.  Skin:    General: Skin is warm and dry.  Neurological:     General: No focal deficit present.     Mental Status: She is alert and oriented to person, place, and time. Mental status is at baseline.  Psychiatric:        Mood and Affect: Mood normal.        Behavior: Behavior normal.        Thought Content: Thought content normal.        Judgment: Judgment normal.   Procedure Pt consented. Given opportunity to ask questions and address concerns. Consent signed. Area sterilized with iodine swab and alcohol prep pads. Device located. Area numbed with 1cc of 1% lidocaine with epinephrine #11 scalpel used to create 74mm incision of superficial depth at proximal end of device. Using alligator forceps, device removed. Pressure bandage applied.  Pt counseled on reasons to return to clinic and infection prevention measures. Pt tolerated procedure well.  BP 102/72    Pulse (!) 107    Temp 98.7 F (37.1 C) (Temporal)    Resp 18    Ht 5\' 6"  (1.676 m)    Wt 232 lb 3.2 oz (105.3 kg)    SpO2 96%    BMI 37.48 kg/m  Wt Readings from Last 3 Encounters:  04/07/20 232 lb 3.2 oz (105.3 kg)  10/28/17 222 lb 6.4 oz (100.9 kg)  06/16/17 217 lb 6.4 oz (98.6 kg)     Health Maintenance Due  Topic Date Due   COVID-19 Vaccine (1) Never done   PAP SMEAR-Modifier  Never done   INFLUENZA VACCINE  Never done    There are no preventive care reminders to display for this patient.  Lab Results   Component Value Date   TSH 2.030 10/30/2016   Lab Results  Component Value Date   WBC 8.0 06/16/2017   HGB 14.8 06/16/2017  HCT 45.5 06/16/2017   MCV 88.6 06/16/2017   PLT 305 10/30/2016   Lab Results  Component Value Date   NA 140 10/30/2016   K 4.1 10/30/2016   CO2 21 10/30/2016   GLUCOSE 96 10/30/2016   BUN 10 10/30/2016   CREATININE 0.61 10/30/2016   BILITOT 0.6 10/30/2016   ALKPHOS 44 10/30/2016   AST 19 10/30/2016   ALT 19 10/30/2016   PROT 7.3 10/30/2016   ALBUMIN 4.6 10/30/2016   CALCIUM 9.4 10/30/2016   Lab Results  Component Value Date   CHOL 147 10/30/2016   Lab Results  Component Value Date   HDL 75 10/30/2016   Lab Results  Component Value Date   LDLCALC 47 10/30/2016   Lab Results  Component Value Date   TRIG 126 10/30/2016   Lab Results  Component Value Date   CHOLHDL 2.0 10/30/2016   Lab Results  Component Value Date   HGBA1C 4.9 10/30/2016      Assessment & Plan:   Problem List Items Addressed This Visit   None Visit Diagnoses     Nexplanon removal    -  Primary       No orders of the defined types were placed in this encounter.   Follow-up: No follow-ups on file.   PLAN Pressure bandage on x 1 day. Keep clean and dry x one week. Monitor sxs of infection. If occurring, pt to call clinic Discussed contraception options. Pt to consider.  Patient encouraged to call clinic with any questions, comments, or concerns.  Janeece Ageeichard Melonee Gerstel, NP

## 2023-04-25 DIAGNOSIS — J069 Acute upper respiratory infection, unspecified: Secondary | ICD-10-CM | POA: Diagnosis not present

## 2023-04-25 DIAGNOSIS — F332 Major depressive disorder, recurrent severe without psychotic features: Secondary | ICD-10-CM | POA: Diagnosis not present

## 2023-04-28 DIAGNOSIS — R059 Cough, unspecified: Secondary | ICD-10-CM | POA: Diagnosis not present

## 2023-04-28 DIAGNOSIS — J069 Acute upper respiratory infection, unspecified: Secondary | ICD-10-CM | POA: Diagnosis not present

## 2023-05-02 DIAGNOSIS — F332 Major depressive disorder, recurrent severe without psychotic features: Secondary | ICD-10-CM | POA: Diagnosis not present

## 2023-05-09 DIAGNOSIS — F332 Major depressive disorder, recurrent severe without psychotic features: Secondary | ICD-10-CM | POA: Diagnosis not present

## 2023-05-16 DIAGNOSIS — F332 Major depressive disorder, recurrent severe without psychotic features: Secondary | ICD-10-CM | POA: Diagnosis not present

## 2023-05-23 DIAGNOSIS — F332 Major depressive disorder, recurrent severe without psychotic features: Secondary | ICD-10-CM | POA: Diagnosis not present

## 2023-05-26 DIAGNOSIS — J111 Influenza due to unidentified influenza virus with other respiratory manifestations: Secondary | ICD-10-CM | POA: Diagnosis not present

## 2023-05-26 DIAGNOSIS — R059 Cough, unspecified: Secondary | ICD-10-CM | POA: Diagnosis not present

## 2023-05-26 DIAGNOSIS — Z20822 Contact with and (suspected) exposure to covid-19: Secondary | ICD-10-CM | POA: Diagnosis not present

## 2023-05-28 DIAGNOSIS — J111 Influenza due to unidentified influenza virus with other respiratory manifestations: Secondary | ICD-10-CM | POA: Diagnosis not present

## 2023-06-03 DIAGNOSIS — R059 Cough, unspecified: Secondary | ICD-10-CM | POA: Diagnosis not present

## 2023-06-03 DIAGNOSIS — J019 Acute sinusitis, unspecified: Secondary | ICD-10-CM | POA: Diagnosis not present

## 2023-06-13 DIAGNOSIS — F332 Major depressive disorder, recurrent severe without psychotic features: Secondary | ICD-10-CM | POA: Diagnosis not present

## 2023-06-20 DIAGNOSIS — F332 Major depressive disorder, recurrent severe without psychotic features: Secondary | ICD-10-CM | POA: Diagnosis not present

## 2023-06-27 DIAGNOSIS — F332 Major depressive disorder, recurrent severe without psychotic features: Secondary | ICD-10-CM | POA: Diagnosis not present

## 2023-07-04 DIAGNOSIS — F332 Major depressive disorder, recurrent severe without psychotic features: Secondary | ICD-10-CM | POA: Diagnosis not present

## 2023-07-10 DIAGNOSIS — F332 Major depressive disorder, recurrent severe without psychotic features: Secondary | ICD-10-CM | POA: Diagnosis not present

## 2023-07-18 DIAGNOSIS — F332 Major depressive disorder, recurrent severe without psychotic features: Secondary | ICD-10-CM | POA: Diagnosis not present

## 2023-08-01 DIAGNOSIS — F332 Major depressive disorder, recurrent severe without psychotic features: Secondary | ICD-10-CM | POA: Diagnosis not present

## 2023-08-08 DIAGNOSIS — F332 Major depressive disorder, recurrent severe without psychotic features: Secondary | ICD-10-CM | POA: Diagnosis not present

## 2023-08-29 DIAGNOSIS — F332 Major depressive disorder, recurrent severe without psychotic features: Secondary | ICD-10-CM | POA: Diagnosis not present

## 2023-09-05 DIAGNOSIS — F332 Major depressive disorder, recurrent severe without psychotic features: Secondary | ICD-10-CM | POA: Diagnosis not present

## 2023-09-12 DIAGNOSIS — F332 Major depressive disorder, recurrent severe without psychotic features: Secondary | ICD-10-CM | POA: Diagnosis not present

## 2023-09-26 DIAGNOSIS — F332 Major depressive disorder, recurrent severe without psychotic features: Secondary | ICD-10-CM | POA: Diagnosis not present

## 2023-10-03 DIAGNOSIS — F332 Major depressive disorder, recurrent severe without psychotic features: Secondary | ICD-10-CM | POA: Diagnosis not present

## 2023-10-10 DIAGNOSIS — F332 Major depressive disorder, recurrent severe without psychotic features: Secondary | ICD-10-CM | POA: Diagnosis not present

## 2023-10-16 DIAGNOSIS — F332 Major depressive disorder, recurrent severe without psychotic features: Secondary | ICD-10-CM | POA: Diagnosis not present

## 2023-10-17 DIAGNOSIS — F332 Major depressive disorder, recurrent severe without psychotic features: Secondary | ICD-10-CM | POA: Diagnosis not present

## 2023-10-24 DIAGNOSIS — F332 Major depressive disorder, recurrent severe without psychotic features: Secondary | ICD-10-CM | POA: Diagnosis not present

## 2023-11-14 DIAGNOSIS — F332 Major depressive disorder, recurrent severe without psychotic features: Secondary | ICD-10-CM | POA: Diagnosis not present

## 2023-12-12 DIAGNOSIS — F332 Major depressive disorder, recurrent severe without psychotic features: Secondary | ICD-10-CM | POA: Diagnosis not present

## 2023-12-15 DIAGNOSIS — R21 Rash and other nonspecific skin eruption: Secondary | ICD-10-CM | POA: Diagnosis not present

## 2023-12-16 DIAGNOSIS — K054 Periodontosis: Secondary | ICD-10-CM | POA: Diagnosis not present

## 2023-12-19 DIAGNOSIS — F332 Major depressive disorder, recurrent severe without psychotic features: Secondary | ICD-10-CM | POA: Diagnosis not present

## 2023-12-22 DIAGNOSIS — K029 Dental caries, unspecified: Secondary | ICD-10-CM | POA: Diagnosis not present

## 2024-01-01 DIAGNOSIS — K029 Dental caries, unspecified: Secondary | ICD-10-CM | POA: Diagnosis not present

## 2024-01-02 DIAGNOSIS — F332 Major depressive disorder, recurrent severe without psychotic features: Secondary | ICD-10-CM | POA: Diagnosis not present
